# Patient Record
Sex: Female | Born: 1985 | Race: Black or African American | Hispanic: No | Marital: Single | Smoking: Current every day smoker
Health system: Southern US, Community
[De-identification: ages and names within clinical notes are randomized; demographics above are authoritative.]

## PROBLEM LIST (undated history)

## (undated) ENCOUNTER — Inpatient Hospital Stay (HOSPITAL_COMMUNITY): Payer: Self-pay

## (undated) DIAGNOSIS — B999 Unspecified infectious disease: Secondary | ICD-10-CM

## (undated) DIAGNOSIS — R87629 Unspecified abnormal cytological findings in specimens from vagina: Secondary | ICD-10-CM

## (undated) DIAGNOSIS — F32A Depression, unspecified: Secondary | ICD-10-CM

## (undated) DIAGNOSIS — K8020 Calculus of gallbladder without cholecystitis without obstruction: Secondary | ICD-10-CM

## (undated) DIAGNOSIS — F99 Mental disorder, not otherwise specified: Secondary | ICD-10-CM

## (undated) DIAGNOSIS — F329 Major depressive disorder, single episode, unspecified: Secondary | ICD-10-CM

## (undated) DIAGNOSIS — N200 Calculus of kidney: Secondary | ICD-10-CM

## (undated) DIAGNOSIS — F419 Anxiety disorder, unspecified: Secondary | ICD-10-CM

## (undated) HISTORY — PX: LITHOTRIPSY: SUR834

---
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
## 2010-08-22 ENCOUNTER — Emergency Department (HOSPITAL_COMMUNITY)
Admission: EM | Admit: 2010-08-22 | Discharge: 2010-08-22 | Disposition: A | Discharge status: Home / Self Care | Payer: Medicaid Other | Attending: Emergency Medicine | Admitting: Emergency Medicine

## 2010-08-22 DIAGNOSIS — IMO0002 Reserved for concepts with insufficient information to code with codable children: Secondary | ICD-10-CM | POA: Insufficient documentation

## 2010-09-03 ENCOUNTER — Encounter: Payer: Self-pay | Admitting: Advanced Practice Midwife

## 2010-10-22 ENCOUNTER — Encounter (HOSPITAL_COMMUNITY): Payer: Self-pay | Admitting: *Deleted

## 2010-10-22 ENCOUNTER — Inpatient Hospital Stay (HOSPITAL_COMMUNITY)
Admission: AD | Admit: 2010-10-22 | Discharge: 2010-10-22 | Disposition: A | Discharge status: Home / Self Care | Payer: Medicaid Other | Source: Ambulatory Visit | Attending: Obstetrics & Gynecology | Admitting: Obstetrics & Gynecology

## 2010-10-22 DIAGNOSIS — B3731 Acute candidiasis of vulva and vagina: Secondary | ICD-10-CM | POA: Insufficient documentation

## 2010-10-22 DIAGNOSIS — R109 Unspecified abdominal pain: Secondary | ICD-10-CM | POA: Insufficient documentation

## 2010-10-22 DIAGNOSIS — B373 Candidiasis of vulva and vagina: Secondary | ICD-10-CM | POA: Insufficient documentation

## 2010-10-22 DIAGNOSIS — N949 Unspecified condition associated with female genital organs and menstrual cycle: Secondary | ICD-10-CM | POA: Insufficient documentation

## 2010-10-22 HISTORY — DX: Anxiety disorder, unspecified: F41.9

## 2010-10-22 HISTORY — DX: Depression, unspecified: F32.A

## 2010-10-22 HISTORY — DX: Mental disorder, not otherwise specified: F99

## 2010-10-22 HISTORY — DX: Major depressive disorder, single episode, unspecified: F32.9

## 2010-10-22 LAB — URINALYSIS, ROUTINE W REFLEX MICROSCOPIC
Bilirubin Urine: NEGATIVE
Glucose, UA: NEGATIVE mg/dL
Hgb urine dipstick: NEGATIVE
Ketones, ur: NEGATIVE mg/dL
Nitrite: NEGATIVE
Specific Gravity, Urine: >1.03 — ABNORMAL HIGH (ref 1.005–1.030)
pH: 6 (ref 5.0–8.0)

## 2010-10-22 LAB — POCT PREGNANCY, URINE: Preg Test, Ur: NEGATIVE

## 2010-10-22 LAB — WET PREP, GENITAL
Clue Cells Wet Prep HPF POC: NONE SEEN
Trich, Wet Prep: NONE SEEN

## 2010-10-22 MED ORDER — FLUCONAZOLE 150 MG PO TABS
150.0000 mg | ORAL_TABLET | Freq: Once | ORAL | Status: AC
  Administered 2010-10-22: 150 mg via ORAL
  Filled 2010-10-22: qty 1

## 2010-10-22 NOTE — Progress Notes (Signed)
Pt c/o vaginal discharge x1 week, white thick. Pt states "I think I have yeast infection"

## 2010-10-22 NOTE — ED Provider Notes (Signed)
History     CSN: [id number] Arrival date & time: 10/22/2010 10:56 AM   None     Chief Complaint  Patient presents with  . Vaginal Discharge   HPI Sue Clayton is a 25 y.o. female who presents to [facility]U for vaginal discharge. The discharge started a week ago. It is characterized as thick white with vaginal itching. The history was provided by the patient.   Past Medical History:  Past Surgical History  Procedure Date  . Cesarean section     No family history on file.  History  Substance Use Topics  . Smoking status: Current Everyday Smoker -- 0.2 packs/day  . Smokeless tobacco: Not on file  . Alcohol Use: No    OB History    Grav Para Term Preterm Abortions TAB SAB Ect Mult Living   5 4 4  1 1    4       Review of Systems  Constitutional: Negative for fever, chills, diaphoresis and fatigue.  HENT: Negative for ear pain, congestion, sore throat, facial swelling, neck pain, neck stiffness, dental problem and sinus pressure.   Eyes: Negative for photophobia, pain and discharge.  Respiratory: Negative for cough, chest tightness and wheezing.   Gastrointestinal: Negative for nausea, vomiting, abdominal pain, diarrhea, constipation and abdominal distention.  Genitourinary: Negative for dysuria, frequency, flank pain and difficulty urinating.  Musculoskeletal: Negative for myalgias, back pain and gait problem.  Skin: Negative for color change and rash.  Neurological: Negative for dizziness, speech difficulty, weakness, light-headedness, numbness and headaches.  Psychiatric/Behavioral: Negative for confusion and agitation.    Allergies  Review of patient's allergies indicates not on file.  Home Medications  No current outpatient prescriptions on file.  LMP 09/16/2010  Physical Exam  Nursing note and vitals reviewed. Constitutional: She is oriented to person, place, and time. She appears well-developed and well-nourished. She appears distressed.  HENT:  Head:  Normocephalic and atraumatic.  Eyes: EOM are normal.  Neck: Neck supple.  Cardiovascular: Normal rate and regular rhythm.   Pulmonary/Chest: Effort normal and breath sounds normal.  Abdominal: Soft.       Tender mildly LLQ and inguinal area. No rebound or guarding.  Genitourinary:       Thick white vaginal discharge. Cervix closed, mild CMT, uterus not enlarged.  Musculoskeletal:       Left flank pain.+  Neurological: She is alert and oriented to person, place, and time. No cranial nerve deficit.  Skin: Skin is warm and dry.   Results for orders placed during the hospital encounter of 10/22/10 (from the past 24 hour(s))  URINALYSIS, ROUTINE W REFLEX MICROSCOPIC     Status: Abnormal   Collection Time   10/22/10 11:10 AM      Component Value Range   Color, Urine YELLOW  YELLOW    Appearance CLEAR  CLEAR    Specific Gravity, Urine >1.030 (*) 1.005 - 1.030    pH 6.0  5.0 - 8.0    Glucose, UA NEGATIVE  NEGATIVE (mg/dL)   Hgb urine dipstick NEGATIVE  NEGATIVE    Bilirubin Urine NEGATIVE  NEGATIVE    Ketones, ur NEGATIVE  NEGATIVE (mg/dL)   Protein, ur NEGATIVE  NEGATIVE (mg/dL)   Urobilinogen, UA 0.2  0.0 - 1.0 (mg/dL)   Nitrite NEGATIVE  NEGATIVE    Leukocytes, UA NEGATIVE  NEGATIVE   PREGNANCY, URINE     Status: Normal   Collection Time   10/22/10 11:10 AM      Component Value  Range   Preg Test, Ur NEGATIVE    POCT PREGNANCY, URINE     Status: Normal   Collection Time   10/22/10 11:25 AM      Component Value Range   Preg Test, Ur NEGATIVE    WET PREP, GENITAL     Status: Abnormal   Collection Time   10/22/10  1:00 PM      Component Value Range   Yeast, Wet Prep FEW (*) NONE SEEN    Trich, Wet Prep NONE SEEN  NONE SEEN    Clue Cells, Wet Prep NONE SEEN  NONE SEEN    WBC, Wet Prep HPF POC FEW (*) NONE SEEN    Assessment: Monilia vaginosis   Chronic abdominal pain  Plan:  Diflucan 150 mg. Po   Follow up with [facility] for chronic pain   Return here as needed for GYN  problems.  ED Course  Procedures MDM    [facility] [place] [facility], NP 10/22/10 1317  [place], NP 10/25/10 1411

## 2010-10-22 NOTE — ED Provider Notes (Signed)
History     CSN: [id number] Arrival date & time: 10/22/2010 10:56 AM   None     Chief Complaint  Patient presents with  . Vaginal Discharge   HPI Comments: Patient states discharge is white in color and "cottage cheese like" and accompanying itching. No history of STIs in the past since the age of 25 y/o when she had Chlamydia.  She is sexually active with single female partner over the last 4 years and was on OCPs for 3 months until last month. She is present infrequently using different methods of contraception.   Past history: patient has had similar episodes, about once every 2-3 months in the past which she attributes to douching.   Abdominal Pain: is present for more than 2 years, gets worse when urination and on doing Keigel's maneuver. She had been seen by a chriropractor who diagnosed her with Nephrolithiasis.   Patient reports h/o Pregnancy induced hypertension during two of her pregnancies  Vaginal Discharge The patient's primary symptoms include genital itching and a vaginal discharge. The patient's pertinent negatives include no genital odor. This is a recurrent problem. The current episode started in the past 7 days. The problem occurs constantly. The problem has been gradually worsening. The patient is experiencing no pain. Associated symptoms include abdominal pain, dysuria, flank pain and painful intercourse. Pertinent negatives include no back pain, chills, constipation, diarrhea, fever, frequency, headaches, nausea, rash, sore throat or vomiting. Her past medical history is significant for an abdominal surgery.  Abdominal Pain This is a chronic problem. The current episode started more than 1 year ago. The onset quality is gradual. The problem occurs constantly. The problem has been unchanged. The pain is located in the LLQ and left flank. The pain is at a severity of 4/10. The pain is moderate. The quality of the pain is aching and colicky. The abdominal pain radiates to  the left flank. Associated symptoms include dysuria. Pertinent negatives include no constipation, diarrhea, fever, frequency, headaches, myalgias, nausea or vomiting. The pain is aggravated by urination. The pain is relieved by nothing. The treatment provided no relief. Her past medical history is significant for abdominal surgery.  Patient is a 25 y.o. female presenting with vaginal discharge and abdominal pain.  Vaginal Discharge This is a recurrent problem. The current episode started in the past 7 days. The problem occurs constantly. The problem has been gradually worsening. Associated symptoms include abdominal pain. Pertinent negatives include no chest pain, chills, congestion, coughing, diaphoresis, fatigue, fever, headaches, myalgias, nausea, neck pain, numbness, rash, sore throat, vomiting or weakness.  Abdominal Pain The primary symptoms of the illness include abdominal pain, dysuria and vaginal discharge. The primary symptoms of the illness do not include fever, fatigue, shortness of breath, nausea, vomiting or diarrhea. The current episode started more than 1 year ago. The onset of the illness was gradual. The problem has been unchanged.  The abdominal pain radiates to the left flank.  The dysuria is associated with dyspareunia. The dysuria is not associated with frequency.  The vaginal discharge is associated with dysuria and dyspareunia.  Symptoms associated with the illness do not include chills, diaphoresis, constipation, frequency or back pain.   Sue Clayton is a 25 y.o. female who presents to [facility]U for vaginal discharge.  Past Medical History: Diagnosed with Anxiety disorder for which she is taking Xanax and Depression for which she is taking Effaxor  Past Surgical History  Procedure Date  . Cesarean section     No  family history on file. History of HTN on Mother's side  History  Substance Use Topics  . Smoking status: Current Everyday Smoker -- 0.2 packs/day  .  Smokeless tobacco: Not on file  . Alcohol Use: No    OB History    Grav Para Term Preterm Abortions TAB SAB Ect Mult Living   5 4 4  1 1    4       Review of Systems  Constitutional: Negative.  Negative for fever, chills, diaphoresis and fatigue.  HENT: Negative for ear pain, congestion, sore throat, facial swelling, neck pain, neck stiffness, dental problem and sinus pressure.   Eyes: Negative for photophobia, pain and discharge.  Respiratory: Negative for cough, chest tightness, shortness of breath and wheezing.   Cardiovascular: Negative for chest pain and leg swelling.  Gastrointestinal: Positive for abdominal pain. Negative for nausea, vomiting, diarrhea, constipation and abdominal distention.  Genitourinary: Positive for dysuria, flank pain, vaginal discharge and dyspareunia. Negative for frequency and difficulty urinating.  Musculoskeletal: Negative for myalgias, back pain and gait problem.  Skin: Negative for color change and rash.  Neurological: Negative for dizziness, speech difficulty, weakness, light-headedness, numbness and headaches.  Psychiatric/Behavioral: Negative for confusion and agitation. The patient is nervous/anxious.     Allergies  Review of patient's allergies indicates no known allergies.  Home Medications  No current outpatient prescriptions on file.  BP 133/84  Pulse 65  Temp(Src) 97.3 F (36.3 C) (Oral)  Resp 20  Ht 5\' 7"  (1.702 m)  Wt 87.998 kg (194 lb)  BMI 30.38 kg/m2  SpO2 99%  LMP 09/16/2010  Physical Exam  Nursing note and vitals reviewed. Constitutional: She is oriented to person, place, and time. She appears well-developed and well-nourished. She appears distressed.  HENT:  Head: Normocephalic and atraumatic.  Eyes: EOM are normal.  Neck: Neck supple.  Cardiovascular: Normal rate, regular rhythm, normal heart sounds and intact distal pulses.   No murmur heard. Pulmonary/Chest: Effort normal and breath sounds normal.  Abdominal:  Soft. Bowel sounds are normal. She exhibits no fluid wave and no ascites. There is tenderness in the left lower quadrant. There is no rigidity, no rebound, no guarding and no CVA tenderness.  Musculoskeletal: Normal range of motion.  Lymphadenopathy:    She has no cervical adenopathy.  Neurological: She is alert and oriented to person, place, and time. No cranial nerve deficit.  Skin: Skin is warm and dry.  Psychiatric: She has a normal mood and affect.    ED Course  Procedures  MDM I have examined this patient with the student and assisted with plan of care.  [address], RN, FNP, [facility]          [place], [place] 10/25/10 [phone]

## 2010-10-23 LAB — GC/CHLAMYDIA PROBE AMP, GENITAL
Chlamydia, DNA Probe: NEGATIVE
GC Probe Amp, Genital: NEGATIVE

## 2010-10-25 NOTE — ED Provider Notes (Signed)
Agree with above note.  Sue Clayton H. 10/25/2010 2:36 PM

## 2010-10-25 NOTE — ED Provider Notes (Signed)
See rest of note written by [facility]. Agree with above note.  Jalal Rauch H. 10/25/2010 2:37 PM

## 2010-11-02 ENCOUNTER — Emergency Department (HOSPITAL_COMMUNITY)
Admission: EM | Admit: 2010-11-02 | Discharge: 2010-11-02 | Disposition: A | Discharge status: Home / Self Care | Payer: Medicaid Other | Attending: Emergency Medicine | Admitting: Emergency Medicine

## 2010-11-02 ENCOUNTER — Emergency Department (HOSPITAL_COMMUNITY): Payer: Medicaid Other

## 2010-11-02 DIAGNOSIS — R197 Diarrhea, unspecified: Secondary | ICD-10-CM | POA: Insufficient documentation

## 2010-11-02 DIAGNOSIS — N201 Calculus of ureter: Secondary | ICD-10-CM | POA: Insufficient documentation

## 2010-11-02 DIAGNOSIS — N760 Acute vaginitis: Secondary | ICD-10-CM | POA: Insufficient documentation

## 2010-11-02 DIAGNOSIS — N739 Female pelvic inflammatory disease, unspecified: Secondary | ICD-10-CM | POA: Insufficient documentation

## 2010-11-02 DIAGNOSIS — B9689 Other specified bacterial agents as the cause of diseases classified elsewhere: Secondary | ICD-10-CM | POA: Insufficient documentation

## 2010-11-02 DIAGNOSIS — N1330 Unspecified hydronephrosis: Secondary | ICD-10-CM | POA: Insufficient documentation

## 2010-11-02 DIAGNOSIS — R110 Nausea: Secondary | ICD-10-CM | POA: Insufficient documentation

## 2010-11-02 DIAGNOSIS — R109 Unspecified abdominal pain: Secondary | ICD-10-CM | POA: Insufficient documentation

## 2010-11-02 DIAGNOSIS — A499 Bacterial infection, unspecified: Secondary | ICD-10-CM | POA: Insufficient documentation

## 2010-11-02 LAB — URINALYSIS, ROUTINE W REFLEX MICROSCOPIC
Bilirubin Urine: NEGATIVE
Glucose, UA: NEGATIVE mg/dL
Specific Gravity, Urine: 1.016 (ref 1.005–1.030)

## 2010-11-02 LAB — BASIC METABOLIC PANEL
BUN: 9 mg/dL (ref 6–23)
Calcium: 9.9 mg/dL (ref 8.4–10.5)
GFR calc Af Amer: >90 mL/min (ref >90)
GFR calc non Af Amer: >90 mL/min (ref >90)
Glucose, Bld: 91 mg/dL (ref 70–99)
Sodium: 140 mEq/L (ref 135–145)

## 2010-11-02 LAB — CBC
Hemoglobin: 11.4 g/dL — ABNORMAL LOW (ref 12.0–15.0)
MCH: 25.6 pg — ABNORMAL LOW (ref 26.0–34.0)
MCHC: 33.6 g/dL (ref 30.0–36.0)
RDW: 15.5 % (ref 11.5–15.5)

## 2010-11-02 LAB — DIFFERENTIAL
Basophils Relative: 1 % (ref 0–1)
Eosinophils Relative: 2 % (ref 0–5)
Monocytes Absolute: 0.4 10*3/uL (ref 0.1–1.0)
Monocytes Relative: 7 % (ref 3–12)
Neutro Abs: 2.1 10*3/uL (ref 1.7–7.7)

## 2010-11-02 LAB — URINE MICROSCOPIC-ADD ON

## 2010-11-02 LAB — WET PREP, GENITAL: Trich, Wet Prep: NONE SEEN

## 2010-11-03 LAB — URINE CULTURE

## 2010-11-03 LAB — GC/CHLAMYDIA PROBE AMP, GENITAL: GC Probe Amp, Genital: NEGATIVE

## 2010-11-09 ENCOUNTER — Other Ambulatory Visit: Payer: Self-pay | Admitting: Urology

## 2010-11-18 ENCOUNTER — Other Ambulatory Visit: Payer: Self-pay

## 2010-11-18 NOTE — Pre-Procedure Instructions (Signed)
Take laxative Sunday evening 5 pm to 6 pm and have a light dinner

## 2010-11-18 NOTE — Pre-Procedure Instructions (Signed)
No Aspirin,Advil or Toradol 72 hours prior to surgery

## 2010-11-19 ENCOUNTER — Other Ambulatory Visit (HOSPITAL_COMMUNITY): Payer: Self-pay | Admitting: *Deleted

## 2010-11-22 ENCOUNTER — Ambulatory Visit (HOSPITAL_COMMUNITY)
Admission: RE | Admit: 2010-11-22 | Discharge: 2010-11-22 | Disposition: A | Discharge status: Home / Self Care | Payer: Medicaid Other | Source: Ambulatory Visit | Attending: Urology | Admitting: Urology

## 2010-11-22 ENCOUNTER — Encounter (HOSPITAL_COMMUNITY): Payer: Self-pay | Admitting: *Deleted

## 2010-11-22 ENCOUNTER — Ambulatory Visit (HOSPITAL_COMMUNITY): Payer: Medicaid Other

## 2010-11-22 ENCOUNTER — Encounter (HOSPITAL_COMMUNITY)
Admission: RE | Disposition: A | Discharge status: Home / Self Care | Payer: Self-pay | Source: Ambulatory Visit | Attending: Urology

## 2010-11-22 DIAGNOSIS — N200 Calculus of kidney: Secondary | ICD-10-CM | POA: Insufficient documentation

## 2010-11-22 DIAGNOSIS — F17200 Nicotine dependence, unspecified, uncomplicated: Secondary | ICD-10-CM | POA: Insufficient documentation

## 2010-11-22 LAB — PREGNANCY, URINE: Preg Test, Ur: NEGATIVE

## 2010-11-22 SURGERY — LITHOTRIPSY, ESWL
Anesthesia: LOCAL | Laterality: Left

## 2010-11-22 MED ORDER — DIAZEPAM 5 MG PO TABS
10.0000 mg | ORAL_TABLET | ORAL | Status: AC
  Administered 2010-11-22: 10 mg via ORAL

## 2010-11-22 MED ORDER — HYDROCODONE-ACETAMINOPHEN 7.5-650 MG PO TABS: 1.0000 | ORAL_TABLET | Freq: Four times a day (QID) | ORAL | Status: AC | PRN

## 2010-11-22 MED ORDER — HYDROCODONE-ACETAMINOPHEN 5-325 MG PO TABS: 1.0000 | ORAL_TABLET | ORAL | Status: DC | PRN

## 2010-11-22 MED ORDER — DIPHENHYDRAMINE HCL 25 MG PO CAPS
ORAL_CAPSULE | ORAL | Status: AC
  Administered 2010-11-22: 25 mg via ORAL
  Filled 2010-11-22: qty 1

## 2010-11-22 MED ORDER — DIPHENHYDRAMINE HCL 25 MG PO CAPS
25.0000 mg | ORAL_CAPSULE | ORAL | Status: AC
  Administered 2010-11-22: 25 mg via ORAL

## 2010-11-22 MED ORDER — CIPROFLOXACIN HCL 500 MG PO TABS
500.0000 mg | ORAL_TABLET | ORAL | Status: AC
  Administered 2010-11-22: 500 mg via ORAL

## 2010-11-22 MED ORDER — DIAZEPAM 5 MG PO TABS
ORAL_TABLET | ORAL | Status: AC
  Administered 2010-11-22: 10 mg via ORAL
  Filled 2010-11-22: qty 2

## 2010-11-22 MED ORDER — CIPROFLOXACIN HCL 500 MG PO TABS
ORAL_TABLET | ORAL | Status: AC
  Administered 2010-11-22: 500 mg via ORAL
  Filled 2010-11-22: qty 1

## 2010-11-22 MED ORDER — DEXTROSE-NACL 5-0.45 % IV SOLN
INTRAVENOUS | Status: DC
  Administered 2010-11-22: 17:00:00 via INTRAVENOUS

## 2010-11-22 NOTE — H&P (Signed)
  History and Physical  Chief Complaint: abdominal pain  History of Present Illness: 25 yo female seen with  1st episode of ureterolithiasis, with L flank pain as follow-up from the ER visit. She was evaluated AT [facility] and found to have moderate left hydronephrosis caused by a proximal 10 x 7 x 12mm stone. No other renal stones. Coincidental gallstones, however.   Past Medical History  Diagnosis Date  . Anxiety   . Mental disorder   . Depression    Past Surgical History  Procedure Date  . Cesarean section     Medications: I have reviewed the patient's current medications. Allergies: No Known Allergies  History reviewed. No pertinent family history. Social History:  reports that she has been smoking.  She does not have any smokeless tobacco history on file. She reports that she does not drink alcohol or use illicit drugs.  ROS: All systems are reviewed and negative except as noted.   Physical Exam:  Vital signs in last 24 hours: Temp:  [98.5 F (36.9 C)] 98.5 F (36.9 C) (11/19 1555) Pulse Rate:  [71] 71  (11/19 1555) Resp:  [14] 14  (11/19 1555) BP: (119)/(72) 119/72 mmHg (11/19 1555) SpO2:  [100 %] 100 % (11/19 1555) Weight:  [90.719 kg (200 lb)] 200 lb (90.719 kg) (11/19 1555)  Cardiovascular: Skin warm; not flushed. Moderate obese. Respiratory: Breaths quiet; no shortness of breath Abdomen: No masses. L flank pain to deep palation Neurological: Normal sensation to touch Musculoskeletal: Normal motor function arms and legs Lymphatics: No inguinal adenopathy Skin: No rashes Genitourinary: normal BUS  Laboratory Data:  Results for orders placed during the hospital encounter of 11/22/10 (from the past 24 hour(s))  PREGNANCY, URINE     Status: Normal   Collection Time   11/22/10  3:59 PM      Component Value Range   Preg Test, Ur NEGATIVE     No results found for this or any previous visit (from the past 240 hour(s)). Creatinine: No results found for this basename:  CREATININE:7 in the last 168 hours  Xrays: See report/chart  Impression/Assessment:  10mm L upper ureteral stone for lithotripsy.  Plan:  Lithotripsy. Note that we discussed all treatment options for her in the office, including JJ stent and ureteroscopy  Sue Clayton I 11/22/2010, 5:39 PM

## 2010-11-22 NOTE — Op Note (Signed)
See [facility] op note ST

## 2011-02-04 ENCOUNTER — Encounter (INDEPENDENT_AMBULATORY_CARE_PROVIDER_SITE_OTHER): Payer: Self-pay | Admitting: Surgery

## 2011-02-07 ENCOUNTER — Ambulatory Visit (INDEPENDENT_AMBULATORY_CARE_PROVIDER_SITE_OTHER): Payer: Self-pay | Admitting: Surgery

## 2011-02-14 ENCOUNTER — Ambulatory Visit (INDEPENDENT_AMBULATORY_CARE_PROVIDER_SITE_OTHER): Payer: Self-pay | Admitting: Surgery

## 2011-03-02 ENCOUNTER — Encounter (INDEPENDENT_AMBULATORY_CARE_PROVIDER_SITE_OTHER): Payer: Self-pay | Admitting: Surgery

## 2011-03-15 ENCOUNTER — Encounter (INDEPENDENT_AMBULATORY_CARE_PROVIDER_SITE_OTHER): Payer: Self-pay

## 2012-01-21 ENCOUNTER — Emergency Department (HOSPITAL_COMMUNITY)
Admission: EM | Admit: 2012-01-21 | Discharge: 2012-01-21 | Disposition: A | Discharge status: Home / Self Care | Payer: Medicaid Other | Attending: Emergency Medicine | Admitting: Emergency Medicine

## 2012-01-21 ENCOUNTER — Emergency Department (HOSPITAL_COMMUNITY): Payer: Medicaid Other

## 2012-01-21 ENCOUNTER — Encounter (HOSPITAL_COMMUNITY): Payer: Self-pay | Admitting: Emergency Medicine

## 2012-01-21 DIAGNOSIS — Z8659 Personal history of other mental and behavioral disorders: Secondary | ICD-10-CM | POA: Insufficient documentation

## 2012-01-21 DIAGNOSIS — F17200 Nicotine dependence, unspecified, uncomplicated: Secondary | ICD-10-CM | POA: Insufficient documentation

## 2012-01-21 DIAGNOSIS — N23 Unspecified renal colic: Secondary | ICD-10-CM | POA: Insufficient documentation

## 2012-01-21 DIAGNOSIS — N200 Calculus of kidney: Secondary | ICD-10-CM | POA: Insufficient documentation

## 2012-01-21 DIAGNOSIS — R110 Nausea: Secondary | ICD-10-CM | POA: Insufficient documentation

## 2012-01-21 DIAGNOSIS — Z79899 Other long term (current) drug therapy: Secondary | ICD-10-CM | POA: Insufficient documentation

## 2012-01-21 LAB — URINALYSIS, ROUTINE W REFLEX MICROSCOPIC
Glucose, UA: NEGATIVE mg/dL
Ketones, ur: 40 mg/dL — AB
Protein, ur: 30 mg/dL — AB
pH: 6 (ref 5.0–8.0)

## 2012-01-21 LAB — CBC WITH DIFFERENTIAL/PLATELET
Basophils Absolute: 0 10*3/uL (ref 0.0–0.1)
Eosinophils Relative: 2 % (ref 0–5)
HCT: 38.7 % (ref 36.0–46.0)
Hemoglobin: 13.3 g/dL (ref 12.0–15.0)
Lymphocytes Relative: 46 % (ref 12–46)
MCHC: 34.4 g/dL (ref 30.0–36.0)
MCV: 77.1 fL — ABNORMAL LOW (ref 78.0–100.0)
Monocytes Absolute: 0.4 10*3/uL (ref 0.1–1.0)
Monocytes Relative: 6 % (ref 3–12)
RDW: 15.6 % — ABNORMAL HIGH (ref 11.5–15.5)
WBC: 6.3 10*3/uL (ref 4.0–10.5)

## 2012-01-21 LAB — BASIC METABOLIC PANEL
BUN: 10 mg/dL (ref 6–23)
CO2: 22 mEq/L (ref 19–32)
Calcium: 10 mg/dL (ref 8.4–10.5)
Creatinine, Ser: 0.72 mg/dL (ref 0.50–1.10)
Glucose, Bld: 94 mg/dL (ref 70–99)

## 2012-01-21 LAB — URINE MICROSCOPIC-ADD ON

## 2012-01-21 MED ORDER — IBUPROFEN 600 MG PO TABS: 600.0000 mg | ORAL_TABLET | Freq: Three times a day (TID) | ORAL | Status: DC | PRN

## 2012-01-21 MED ORDER — HYDROCODONE-ACETAMINOPHEN 5-325 MG PO TABS: 1.0000 | ORAL_TABLET | ORAL | Status: DC | PRN

## 2012-01-21 MED ORDER — HYDROMORPHONE HCL PF 1 MG/ML IJ SOLN
1.0000 mg | Freq: Once | INTRAMUSCULAR | Status: AC
  Administered 2012-01-21: 1 mg via INTRAVENOUS
  Filled 2012-01-21: qty 1

## 2012-01-21 MED ORDER — SODIUM CHLORIDE 0.9 % IV SOLN
1000.0000 mL | Freq: Once | INTRAVENOUS | Status: AC
  Administered 2012-01-21: 1000 mL via INTRAVENOUS

## 2012-01-21 MED ORDER — SODIUM CHLORIDE 0.9 % IV SOLN
1000.0000 mL | INTRAVENOUS | Status: DC
  Administered 2012-01-21: 1000 mL via INTRAVENOUS

## 2012-01-21 MED ORDER — KETOROLAC TROMETHAMINE 30 MG/ML IJ SOLN
30.0000 mg | Freq: Once | INTRAMUSCULAR | Status: AC
  Administered 2012-01-21: 30 mg via INTRAVENOUS
  Filled 2012-01-21: qty 1

## 2012-01-21 MED ORDER — ONDANSETRON HCL 4 MG/2ML IJ SOLN
4.0000 mg | Freq: Once | INTRAMUSCULAR | Status: AC
  Administered 2012-01-21: 4 mg via INTRAVENOUS
  Filled 2012-01-21: qty 2

## 2012-01-21 NOTE — ED Provider Notes (Signed)
History     CSN: [id number]  Arrival date & time 01/21/12  0915   First MD Initiated Contact with Patient 01/21/12 1001      Chief Complaint  Patient presents with  . Flank Pain  . Nephrolithiasis    (Consider location/radiation/quality/duration/timing/severity/associated sxs/prior treatment) The history is provided by the patient.   the patient reports worsening intermittent left flank and left lower quadrant abdominal pain which began 2-3 days ago.  Today her pain became more severe.  She was nauseated.  No vomiting.  No diarrhea.  She has a history of ureteral stones and states this feels similar.  No dysuria or urinary frequency.  No vaginal complaints.  No hematemesis.  No fevers or chills.  Her pain is moderate to severe at this time.  Nothing worsens or improves her pain.  Her pain has been colicky.  Past Medical History  Diagnosis Date  . Anxiety   . Mental disorder   . Depression     Past Surgical History  Procedure Date  . Cesarean section   . Lithotripsy     No family history on file.  History  Substance Use Topics  . Smoking status: Current Every Day Smoker -- 0.2 packs/day  . Smokeless tobacco: Not on file  . Alcohol Use: No    OB History    Grav Para Term Preterm Abortions TAB SAB Ect Mult Living   5 4 4  1 1    4       Review of Systems  Genitourinary: Positive for flank pain.  All other systems reviewed and are negative.    Allergies  Review of patient's allergies indicates no known allergies.  Home Medications   Current Outpatient Rx  Name  Route  Sig  Dispense  Refill  . PHENTERMINE HCL 37.5 MG PO CAPS   Oral   Take 37.5 mg by mouth daily.         Marland Kitchen HYDROCODONE-ACETAMINOPHEN 5-325 MG PO TABS   Oral   Take 1 tablet by mouth every 4 (four) hours as needed for pain.   12 tablet   0   . IBUPROFEN 600 MG PO TABS   Oral   Take 1 tablet (600 mg total) by mouth every 8 (eight) hours as needed for pain.   15 tablet   0     BP  123/85  Pulse 52  Temp 98 F (36.7 C) (Oral)  Resp 20  SpO2 100%  LMP 01/20/2012  Physical Exam  Nursing note and vitals reviewed. Constitutional: She is oriented to person, place, and time. She appears well-developed and well-nourished.       Uncomfortable appearing  HENT:  Head: Normocephalic and atraumatic.  Eyes: EOM are normal.  Neck: Normal range of motion.  Cardiovascular: Normal rate, regular rhythm and normal heart sounds.   Pulmonary/Chest: Effort normal and breath sounds normal.  Abdominal: Soft. She exhibits no distension. There is no tenderness.  Genitourinary:       No cva tenderness  Musculoskeletal: Normal range of motion.  Neurological: She is alert and oriented to person, place, and time.  Skin: Skin is warm and dry.  Psychiatric: She has a normal mood and affect. Judgment normal.    ED Course  Procedures (including critical care time)  Labs Reviewed  CBC WITH DIFFERENTIAL - Abnormal; Notable for the following:    MCV 77.1 (*)     RDW 15.6 (*)     All other components within normal limits  URINALYSIS, ROUTINE W REFLEX MICROSCOPIC - Abnormal; Notable for the following:    Color, Urine AMBER (*)  BIOCHEMICALS MAY BE AFFECTED BY COLOR   APPearance CLOUDY (*)     Hgb urine dipstick LARGE (*)     Bilirubin Urine MODERATE (*)     Ketones, ur 40 (*)     Protein, ur 30 (*)     Leukocytes, UA SMALL (*)     All other components within normal limits  BASIC METABOLIC PANEL  PREGNANCY, URINE  URINE MICROSCOPIC-ADD ON   Ct Abdomen Pelvis Wo Contrast  01/21/2012  *RADIOLOGY REPORT*  Clinical Data: Left-sided flank pain, history of renal stones, hematuria, prior lithotripsy  CT ABDOMEN AND PELVIS WITHOUT CONTRAST  Technique:  Multidetector CT imaging of the abdomen and pelvis was performed following the standard protocol without intravenous contrast.  Comparison: 01/17/2011; 11/02/2010  Findings:  The lack of intravenous contrast limits the ability to evaluate solid  abdominal organs.  Interval passage of previously noted residual 3 mm stone within the inferior pole of the left kidney.  Currently, there are no renal stones seen within either kidney or within the expected location of either ureter or the urinary bladder.  Incidental note is made of a phlebolith within the left hemi pelvis (image 68, series 2), similar to prior abdominal CT performed 11/02/2010.  No urinary obstruction or perinephric stranding.  Normal hepatic contour.  Layering opaque gallstones are seen with an otherwise normal-appearing gallbladder.  No ascites.  Normal noncontrast appearance of the bilateral adrenal glands, pancreas and spleen.  The bowel is normal in course and caliber without wall thickening or evidence of obstruction. No pneumoperitoneum, pneumatosis or portal venous gas.  Normal caliber abdominal aorta.  No retroperitoneal, mesenteric, pelvic or inguinal lymphadenopathy.  Normal noncontrast appearance of the pelvic organs.  No discrete adnexal lesion.  No free fluid in the pelvis.  Limited visualization of the lower thorax negative for focal airspace opacity or pleural effusion.  Normal heart size.  No pericardial effusion.  No acute or aggressive osseous abnormalities.  IMPRESSION: 1.  No explanation for patient's left flank pain.  Specifically, no evidence of nephrolithiasis urinary obstruction.  2.  Cholelithiasis.   Original Report Authenticated By: Tacey Ruiz, MD    I personally reviewed the imaging tests through PACS system I reviewed available ER/hospitalization records through the EMR   1. Ureteral colic       MDM  12:35 PM Resolution of symptoms. Suspect recently passed ureteral stone given story, blood in urine and rapid improvement in symptoms. Dc home        Lyanne Co, MD 01/21/12 1236

## 2012-01-21 NOTE — ED Notes (Signed)
Pt unable to void at this time. 

## 2012-01-21 NOTE — ED Notes (Addendum)
C/o left flank pain, had kidney stone last year- states feels the same- pain started 2 days ago- worse last night- crying. Nauseated, writhing in pain

## 2012-01-21 NOTE — ED Notes (Signed)
Dr Patria Mane advised can give 1 mg of dilaudid if pt still in pain post CT.

## 2012-01-21 NOTE — ED Notes (Signed)
Pt resting comfortably in bed; states "I feel a lot better." Pt made aware to let RN know if pain starts to increase.

## 2012-07-03 ENCOUNTER — Inpatient Hospital Stay (HOSPITAL_COMMUNITY)
Admission: AD | Admit: 2012-07-03 | Discharge: 2012-07-03 | Disposition: A | Discharge status: Home / Self Care | Payer: Medicaid Other | Source: Ambulatory Visit | Attending: Family Medicine | Admitting: Family Medicine

## 2012-07-03 ENCOUNTER — Encounter (HOSPITAL_COMMUNITY): Payer: Self-pay

## 2012-07-03 DIAGNOSIS — R1032 Left lower quadrant pain: Secondary | ICD-10-CM | POA: Insufficient documentation

## 2012-07-03 DIAGNOSIS — N730 Acute parametritis and pelvic cellulitis: Secondary | ICD-10-CM

## 2012-07-03 LAB — URINALYSIS, ROUTINE W REFLEX MICROSCOPIC
Leukocytes, UA: NEGATIVE
Nitrite: NEGATIVE
Specific Gravity, Urine: 1.025 (ref 1.005–1.030)
Urobilinogen, UA: 0.2 mg/dL (ref 0.0–1.0)
pH: 7.5 (ref 5.0–8.0)

## 2012-07-03 LAB — WET PREP, GENITAL

## 2012-07-03 LAB — POCT PREGNANCY, URINE: Preg Test, Ur: NEGATIVE

## 2012-07-03 MED ORDER — AZITHROMYCIN 250 MG PO TABS
1000.0000 mg | ORAL_TABLET | ORAL | Status: AC
  Administered 2012-07-03: 1000 mg via ORAL
  Filled 2012-07-03: qty 4

## 2012-07-03 MED ORDER — AZITHROMYCIN 500 MG PO TABS: ORAL_TABLET | ORAL | Status: DC

## 2012-07-03 MED ORDER — CEFTRIAXONE SODIUM 250 MG IJ SOLR
250.0000 mg | INTRAMUSCULAR | Status: AC
  Administered 2012-07-03: 250 mg via INTRAMUSCULAR
  Filled 2012-07-03: qty 250

## 2012-07-03 MED ORDER — DEXTROSE 5 % IV SOLN
250.0000 mg | INTRAVENOUS | Status: DC
  Filled 2012-07-03: qty 250

## 2012-07-03 NOTE — MAU Provider Note (Signed)
Chart reviewed and agree with management and plan.  

## 2012-07-03 NOTE — MAU Note (Signed)
Patient is in with c/o llq pain radiating to her lower back that started a week ago but is intense today and odorous vaginal discharge. Denies vaginal bleeding, or dysuria. lmp 6/16

## 2012-07-03 NOTE — MAU Provider Note (Signed)
History of Present Illness     27 yo [phone], hcG negative with history of kidney stones in 2013, trich and chlamydia as a teenager that presents today with one week of constant LLQ pain and odorous white vaginal discharge.  LLQ "numbing" pain radiates to back and is worse after peeing.  Denies hematuria, vaginal bleeding or itching, fevers, chills, nausea, vomiting, or changes in bowel.  Reports she took her aunt's hydrocodone 10mg  for pain.  Does vaginal douching pretty frequently with her last one couple days ago.     CSN: [id number]  Arrival date and time: 07/03/12 0910   First Provider Initiated Contact with Patient 07/03/12 [phone]      Chief Complaint  Patient presents with  . Abdominal Pain  . Vaginal Discharge   HPI    Past Medical History  Diagnosis Date  . Anxiety   . Mental disorder   . Depression     Past Surgical History  Procedure Laterality Date  . Cesarean section    . Lithotripsy      History  Substance Use Topics  . Smoking status: Current Every Day Smoker -- 0.25 packs/day  . Smokeless tobacco: Not on file  . Alcohol Use: No    Allergies: No Known Allergies  No prescriptions prior to admission    ROS as per HPI  Physical Exam   Blood pressure 137/84, pulse 72, temperature 98.4 F (36.9 C), temperature source Oral, resp. rate 18, height 5\' 7"  (1.702 m), weight 85.333 kg (188 lb 2 oz), last menstrual period 06/18/2012, SpO2 100.00%.  Physical Exam General:  Lying down, cooperative, in no acute distress Cardiovascular:  RRR Lungs:  CTAB Abdomen: mild LLQ and RLQ pain, 2x2 cm mass 2-3 inches below umbilicus  Negative CVA tenderness Pelvic exam: Cervix pink, visually closed, without lesion, moderate amount white creamy discharge, vaginal walls and external genitalia normal Bimanual exam: Cervix 0/long/high, firm, anterior, neg CMT, uterus nontender, nonenlarged, adnexa with mild tenderness, no enlargement or mass bilaterally  Results for orders  placed during the hospital encounter of 07/03/12 (from the past 24 hour(s))  URINALYSIS, ROUTINE W REFLEX MICROSCOPIC     Status: None   Collection Time    07/03/12  9:30 AM      Result Value Range   Color, Urine YELLOW  YELLOW   APPearance CLEAR  CLEAR   Specific Gravity, Urine 1.025  1.005 - 1.030   pH 7.5  5.0 - 8.0   Glucose, UA NEGATIVE  NEGATIVE mg/dL   Hgb urine dipstick NEGATIVE  NEGATIVE   Bilirubin Urine NEGATIVE  NEGATIVE   Ketones, ur NEGATIVE  NEGATIVE mg/dL   Protein, ur NEGATIVE  NEGATIVE mg/dL   Urobilinogen, UA 0.2  0.0 - 1.0 mg/dL   Nitrite NEGATIVE  NEGATIVE   Leukocytes, UA NEGATIVE  NEGATIVE  POCT PREGNANCY, URINE     Status: None   Collection Time    07/03/12  9:36 AM      Result Value Range   Preg Test, Ur NEGATIVE  NEGATIVE    MAU Course  Procedures:  None    Assessment and Plan  27 yo [id number] not currently pregnant presenting with LLQ pain and vaginal discharge.    1. PID (acute pelvic inflammatory disease)     Plan: Rocepin 250 mg IM, azithromycin 1000 mg PO x1 dose now, 1 dose in 1 week (per Up to Date) r/t high cost of doxycycline GC pending Return to MAU if symptoms worsen or persisit  I have seen this patient and agree with the above resident's note.  LEFTWICH-KIRBY, Ziaire Bieser 07/03/2012, 10:52 AM

## 2012-07-04 LAB — GC/CHLAMYDIA PROBE AMP
CT Probe RNA: NEGATIVE
GC Probe RNA: NEGATIVE

## 2013-04-16 ENCOUNTER — Emergency Department (HOSPITAL_COMMUNITY)
Admission: EM | Admit: 2013-04-16 | Discharge: 2013-04-16 | Disposition: A | Discharge status: Home / Self Care | Payer: Medicaid Other | Attending: Emergency Medicine | Admitting: Emergency Medicine

## 2013-04-16 ENCOUNTER — Encounter (HOSPITAL_COMMUNITY): Payer: Self-pay | Admitting: Emergency Medicine

## 2013-04-16 ENCOUNTER — Emergency Department (HOSPITAL_COMMUNITY): Payer: Medicaid Other

## 2013-04-16 DIAGNOSIS — Z3202 Encounter for pregnancy test, result negative: Secondary | ICD-10-CM | POA: Insufficient documentation

## 2013-04-16 DIAGNOSIS — Z87442 Personal history of urinary calculi: Secondary | ICD-10-CM | POA: Insufficient documentation

## 2013-04-16 DIAGNOSIS — Z792 Long term (current) use of antibiotics: Secondary | ICD-10-CM | POA: Insufficient documentation

## 2013-04-16 DIAGNOSIS — Z8659 Personal history of other mental and behavioral disorders: Secondary | ICD-10-CM | POA: Insufficient documentation

## 2013-04-16 DIAGNOSIS — R110 Nausea: Secondary | ICD-10-CM | POA: Insufficient documentation

## 2013-04-16 DIAGNOSIS — N390 Urinary tract infection, site not specified: Secondary | ICD-10-CM | POA: Insufficient documentation

## 2013-04-16 DIAGNOSIS — R109 Unspecified abdominal pain: Secondary | ICD-10-CM

## 2013-04-16 DIAGNOSIS — R1032 Left lower quadrant pain: Secondary | ICD-10-CM

## 2013-04-16 DIAGNOSIS — N898 Other specified noninflammatory disorders of vagina: Secondary | ICD-10-CM | POA: Insufficient documentation

## 2013-04-16 DIAGNOSIS — F17200 Nicotine dependence, unspecified, uncomplicated: Secondary | ICD-10-CM | POA: Insufficient documentation

## 2013-04-16 DIAGNOSIS — Z79899 Other long term (current) drug therapy: Secondary | ICD-10-CM | POA: Insufficient documentation

## 2013-04-16 LAB — CBC WITH DIFFERENTIAL/PLATELET
BASOS ABS: 0 10*3/uL (ref 0.0–0.1)
Basophils Relative: 0 % (ref 0–1)
EOS PCT: 2 % (ref 0–5)
Eosinophils Absolute: 0.2 10*3/uL (ref 0.0–0.7)
HCT: 34.7 % — ABNORMAL LOW (ref 36.0–46.0)
Hemoglobin: 11.7 g/dL — ABNORMAL LOW (ref 12.0–15.0)
LYMPHS PCT: 50 % — AB (ref 12–46)
Lymphs Abs: 4.5 10*3/uL — ABNORMAL HIGH (ref 0.7–4.0)
MCH: 27.2 pg (ref 26.0–34.0)
MCHC: 33.7 g/dL (ref 30.0–36.0)
MCV: 80.7 fL (ref 78.0–100.0)
Monocytes Absolute: 0.7 10*3/uL (ref 0.1–1.0)
Monocytes Relative: 7 % (ref 3–12)
Neutro Abs: 3.8 10*3/uL (ref 1.7–7.7)
Neutrophils Relative %: 41 % — ABNORMAL LOW (ref 43–77)
PLATELETS: 256 10*3/uL (ref 150–400)
RBC: 4.3 MIL/uL (ref 3.87–5.11)
RDW: 14.7 % (ref 11.5–15.5)
WBC: 9.2 10*3/uL (ref 4.0–10.5)

## 2013-04-16 LAB — COMPREHENSIVE METABOLIC PANEL
ALBUMIN: 3.5 g/dL (ref 3.5–5.2)
ALT: 17 U/L (ref 0–35)
AST: 21 U/L (ref 0–37)
Alkaline Phosphatase: 75 U/L (ref 39–117)
BUN: 12 mg/dL (ref 6–23)
CALCIUM: 9.1 mg/dL (ref 8.4–10.5)
CO2: 22 meq/L (ref 19–32)
Chloride: 103 mEq/L (ref 96–112)
Creatinine, Ser: 0.75 mg/dL (ref 0.50–1.10)
GFR calc Af Amer: >90 mL/min (ref >90)
GFR calc non Af Amer: >90 mL/min (ref >90)
Glucose, Bld: 98 mg/dL (ref 70–99)
Potassium: 3.8 mEq/L (ref 3.7–5.3)
SODIUM: 140 meq/L (ref 137–147)
TOTAL PROTEIN: 7.2 g/dL (ref 6.0–8.3)
Total Bilirubin: <0.2 mg/dL — ABNORMAL LOW (ref 0.3–1.2)

## 2013-04-16 LAB — URINALYSIS, ROUTINE W REFLEX MICROSCOPIC
Bilirubin Urine: NEGATIVE
GLUCOSE, UA: NEGATIVE mg/dL
Hgb urine dipstick: NEGATIVE
Ketones, ur: NEGATIVE mg/dL
Nitrite: NEGATIVE
PH: 6 (ref 5.0–8.0)
Protein, ur: NEGATIVE mg/dL
SPECIFIC GRAVITY, URINE: 1.023 (ref 1.005–1.030)
Urobilinogen, UA: 0.2 mg/dL (ref 0.0–1.0)

## 2013-04-16 LAB — GC/CHLAMYDIA PROBE AMP
CT Probe RNA: NEGATIVE
GC Probe RNA: NEGATIVE

## 2013-04-16 LAB — WET PREP, GENITAL
Clue Cells Wet Prep HPF POC: NONE SEEN
Trich, Wet Prep: NONE SEEN
WBC, Wet Prep HPF POC: NONE SEEN
YEAST WET PREP: NONE SEEN

## 2013-04-16 LAB — I-STAT TROPONIN, ED: Troponin i, poc: 0 ng/mL (ref 0.00–0.08)

## 2013-04-16 LAB — RPR

## 2013-04-16 LAB — URINE MICROSCOPIC-ADD ON

## 2013-04-16 LAB — POC URINE PREG, ED: Preg Test, Ur: NEGATIVE

## 2013-04-16 LAB — HIV ANTIBODY (ROUTINE TESTING W REFLEX): HIV: NONREACTIVE

## 2013-04-16 LAB — LIPASE, BLOOD: Lipase: 35 U/L (ref 11–59)

## 2013-04-16 MED ORDER — OXYCODONE-ACETAMINOPHEN 5-325 MG PO TABS
2.0000 | ORAL_TABLET | Freq: Once | ORAL | Status: AC
  Administered 2013-04-16: 2 via ORAL
  Filled 2013-04-16: qty 2

## 2013-04-16 MED ORDER — CEPHALEXIN 500 MG PO CAPS: 500.0000 mg | ORAL_CAPSULE | Freq: Four times a day (QID) | ORAL | Status: DC

## 2013-04-16 MED ORDER — HYDROCODONE-ACETAMINOPHEN 5-325 MG PO TABS: 1.0000 | ORAL_TABLET | ORAL | Status: DC | PRN

## 2013-04-16 MED ORDER — MORPHINE SULFATE 4 MG/ML IJ SOLN
4.0000 mg | Freq: Once | INTRAMUSCULAR | Status: AC
  Administered 2013-04-16: 4 mg via INTRAVENOUS
  Filled 2013-04-16: qty 1

## 2013-04-16 MED ORDER — IOHEXOL 300 MG/ML  SOLN
80.0000 mL | Freq: Once | INTRAMUSCULAR | Status: AC | PRN
  Administered 2013-04-16: 80 mL via INTRAVENOUS

## 2013-04-16 MED ORDER — IOHEXOL 300 MG/ML  SOLN
25.0000 mL | Freq: Once | INTRAMUSCULAR | Status: AC | PRN
  Administered 2013-04-16: 25 mL via ORAL

## 2013-04-16 MED ORDER — PROMETHAZINE HCL 25 MG PO TABS: 25.0000 mg | ORAL_TABLET | Freq: Four times a day (QID) | ORAL | Status: DC | PRN

## 2013-04-16 NOTE — ED Provider Notes (Signed)
CSN: [id number]     Arrival date & time 04/16/13  0553 History   First MD Initiated Contact with Patient 04/16/13 [phone]     Chief Complaint  Patient presents with  . Flank Pain     (Consider location/radiation/quality/duration/timing/severity/associated sxs/prior Treatment) The history is provided by the patient and medical records. No language interpreter was used.    Pryor MontesLoushana C Carmicheal is a 28 y.o. female  with a hx of anxiety, depression, kidney stone (last one > 1 year ago) presents to the Emergency Department complaining of intermittent progressively worsening left flank and LLQ abd pain onset 1 month ago. Pain is described as sharp and radiates into her back.  The pain became unbearable last night.  She has tolerated PO without difficulty throughout the entire time she has had pain.  Associated symptoms include increased pain with urination, but no burning.  Pt has been taking tylenol #3 and ibuprofen without relief.  Pt denies fever, chills, headache, neck pain, chest pain, SOB, vaginal pain, hematuria.  Pt reports white vaginal discharge.  Pt is sexually active with 1 partner in the last year and remote hx of STD.  LMP: March 30th.        Past Medical History  Diagnosis Date  . Anxiety   . Mental disorder   . Depression    Past Surgical History  Procedure Laterality Date  . Cesarean section    . Lithotripsy     No family history on file. History  Substance Use Topics  . Smoking status: Current Every Day Smoker -- 0.25 packs/day  . Smokeless tobacco: Not on file  . Alcohol Use: No   OB History   Grav Para Term Preterm Abortions TAB SAB Ect Mult Living   6 4 4  2 2    4      Review of Systems  Constitutional: Negative for fever, diaphoresis, appetite change, fatigue and unexpected weight change.  HENT: Negative for mouth sores and trouble swallowing.   Respiratory: Negative for cough, chest tightness, shortness of breath, wheezing and stridor.   Cardiovascular:  Negative for chest pain and palpitations.  Gastrointestinal: Positive for nausea and abdominal pain. Negative for vomiting, diarrhea, constipation, blood in stool, abdominal distention and rectal pain.  Genitourinary: Negative for dysuria, urgency, frequency, hematuria, flank pain and difficulty urinating.  Musculoskeletal: Negative for back pain, neck pain and neck stiffness.  Skin: Negative for rash.  Neurological: Negative for weakness.  Hematological: Negative for adenopathy.  Psychiatric/Behavioral: Negative for confusion.  All other systems reviewed and are negative.     Allergies  Review of patient's allergies indicates no known allergies.  Home Medications   Current Outpatient Rx  Name  Route  Sig  Dispense  Refill  . acetaminophen-codeine (TYLENOL #3) 300-30 MG per tablet   Oral   Take 1 tablet by mouth every 4 (four) hours as needed for moderate pain.         Marland Kitchen. ibuprofen (ADVIL,MOTRIN) 800 MG tablet   Oral   Take 800 mg by mouth every 8 (eight) hours as needed for mild pain.         . cephALEXin (KEFLEX) 500 MG capsule   Oral   Take 1 capsule (500 mg total) by mouth 4 (four) times daily.   40 capsule   0   . HYDROcodone-acetaminophen (NORCO/VICODIN) 5-325 MG per tablet   Oral   Take 1-2 tablets by mouth every 4 (four) hours as needed.   15 tablet   0   .  promethazine (PHENERGAN) 25 MG tablet   Oral   Take 1 tablet (25 mg total) by mouth every 6 (six) hours as needed for nausea or vomiting.   12 tablet   0    BP 99/54  Pulse 53  Temp(Src) 97.9 F (36.6 C) (Oral)  Resp 15  Ht 5\' 7"  (1.702 m)  Wt 234 lb (106.142 kg)  BMI 36.64 kg/m2  SpO2 99%  LMP 04/01/2013 Physical Exam  Nursing note and vitals reviewed. Constitutional: She is oriented to person, place, and time. She appears well-developed and well-nourished. No distress.  Awake, alert, nontoxic appearance  HENT:  Head: Normocephalic and atraumatic.  Mouth/Throat: Oropharynx is clear and  moist. No oropharyngeal exudate.  Eyes: Conjunctivae are normal. No scleral icterus.  Neck: Normal range of motion. Neck supple.  Cardiovascular: Normal rate, regular rhythm, normal heart sounds and intact distal pulses.   No murmur heard. Pulmonary/Chest: Effort normal and breath sounds normal. No respiratory distress. She has no wheezes.  Abdominal: Soft. Bowel sounds are normal. She exhibits no distension and no mass. There is no hepatosplenomegaly. There is tenderness (LLQ, no suprapubic tenderness). There is CVA tenderness (Left). There is no rebound. Hernia confirmed negative in the right inguinal area and confirmed negative in the left inguinal area.  LLQ abd pain with guarding  no rebound, no peritoneal signs   Genitourinary: Vagina normal and uterus normal. Pelvic exam was performed with patient supine. There is no rash, tenderness or lesion on the right labia. There is no rash, tenderness or lesion on the left labia. Uterus is not deviated, not enlarged, not fixed and not tender. Cervix exhibits no motion tenderness, no discharge and no friability. Right adnexum displays no mass, no tenderness and no fullness. Left adnexum displays no mass, no tenderness and no fullness. No erythema, tenderness or bleeding around the vagina. No foreign body around the vagina. No signs of injury around the vagina. No vaginal discharge found.  Musculoskeletal: Normal range of motion. She exhibits no edema.  Lymphadenopathy:    She has no cervical adenopathy.       Right: No inguinal adenopathy present.       Left: No inguinal adenopathy present.  Neurological: She is alert and oriented to person, place, and time. She exhibits normal muscle tone. Coordination normal.  Speech is clear and goal oriented Moves extremities without ataxia  Skin: Skin is warm and dry. No rash noted. She is not diaphoretic.  Psychiatric: She has a normal mood and affect.    ED Course  Procedures (including critical care  time) Labs Review Labs Reviewed  CBC WITH DIFFERENTIAL - Abnormal; Notable for the following:    Hemoglobin 11.7 (*)    HCT 34.7 (*)    Neutrophils Relative % 41 (*)    Lymphocytes Relative 50 (*)    Lymphs Abs 4.5 (*)    All other components within normal limits  COMPREHENSIVE METABOLIC PANEL - Abnormal; Notable for the following:    Total Bilirubin <0.2 (*)    All other components within normal limits  URINALYSIS, ROUTINE W REFLEX MICROSCOPIC - Abnormal; Notable for the following:    Leukocytes, UA SMALL (*)    All other components within normal limits  URINE MICROSCOPIC-ADD ON - Abnormal; Notable for the following:    Squamous Epithelial / LPF FEW (*)    Bacteria, UA FEW (*)    All other components within normal limits  WET PREP, GENITAL  GC/CHLAMYDIA PROBE AMP  URINE CULTURE  LIPASE, BLOOD  RPR  HIV ANTIBODY (ROUTINE TESTING)  POC URINE PREG, ED  I-STAT TROPOININ, ED   Imaging Review Ct Abdomen Pelvis W Contrast  04/16/2013   CLINICAL DATA:  Left-sided pain for 1 month history of kidney stones.  EXAM: CT ABDOMEN AND PELVIS WITH CONTRAST  TECHNIQUE: Multidetector CT imaging of the abdomen and pelvis was performed using the standard protocol following bolus administration of intravenous contrast.  CONTRAST:  80mL OMNIPAQUE IOHEXOL 300 MG/ML  SOLN  COMPARISON:  CT ABD/PELV WO CM dated 01/21/2012  FINDINGS: Lung bases are clear.  No pericardial fluid.  No focal hepatic lesion. The gallbladder contains several dependent gallstones. No gallbladder wall thickening or pericholecystic fluid. Pancreas, spleen, adrenal glands normal. No nephrolithiasis or ureterolithiasis. No obstructive uropathy.  The stomach, small bowel, appendix, and cecum are normal. The colon and rectosigmoid colon are normal.  Abdominal aorta is normal caliber. No retroperitoneal periportal lymphadenopathy.  No free fluid the pelvis. Uterus and ovaries are normal. Bladder is normal. No aggressive osseous lesion. Small  focus of midline subcutaneous scarring just inferior to the umbilicus is unchanged from prior.  IMPRESSION: 1. Cholelithiasis without evidence of cholecystitis. 2. No nephrolithiasis or ureterolithiasis. 3. Normal appendix   Electronically Signed   By: Genevive Bi M.D.   On: 04/16/2013 11:06     EKG Interpretation None      MDM   Final diagnoses:  Left flank pain  LLQ abdominal pain  UTI (lower urinary tract infection)   Fabio Asa C Tedder presents with LLQ abd pain and left flank pain; physical exam with mild left CVA tenderness. Pt denies vaginal or urinary symptoms.  No hgb in UA; small leuks and 7-10WBCs, but I am not sure that this is a UTI.  Will culture.  Labs reassuring, patient without leukocytosis.  Concern for possible renal calculi versus, versus diverticulitis.  PID and tubo-ovarian abscess considered though less likely. We'll obtain a pelvic exam to assess.  Patient alert, oriented, nontoxic nonseptic appearing. She is afebrile and not tachycardic.  8:40AM Exam unremarkable and without cervical motion tenderness. Wet prep without evidence of yeast or infection.  Obtain CT abdomen for further evaluation of possible kidney stones versus diverticulitis.  9:30AM Patient with pain controlled. CT scan pending.  10:32 AM CT scan still pending, patient remains comfortable. No emesis in the department.  11:30 AM CT without nephrolithiasis or other acute abnormality.  Gallstones noted, but pt has no pain in the RQU, N/V or other biliary symptoms.  Pt with painful urination.  Will treat as UTI and culture.  Patient instructed to followup with OB/GYN within 3 days for further evaluation. Also instructed to find a primary care physician for followup.  Patient given strict return precautions including intractable vomiting, fevers or worsening abdominal pain.  It has been determined that no acute conditions requiring further emergency intervention are present at this time. The  patient/guardian have been advised of the diagnosis and plan. We have discussed signs and symptoms that warrant return to the ED, such as changes or worsening in symptoms.   Vital signs are stable at discharge.   BP 99/54  Pulse 53  Temp(Src) 97.9 F (36.6 C) (Oral)  Resp 15  Ht 5\' 7"  (1.702 m)  Wt 234 lb (106.142 kg)  BMI 36.64 kg/m2  SpO2 99%  LMP 04/01/2013  Patient/guardian has voiced understanding and agreed to follow-up with the PCP or specialist.       Dierdre Forth, PA-C 04/16/13 1131

## 2013-04-16 NOTE — ED Notes (Signed)
Pt finished her oral contrast CT made aware.

## 2013-04-16 NOTE — ED Notes (Signed)
CT called reporting 15-20 mins before patient comes over. PA and patient made aware.

## 2013-04-16 NOTE — ED Notes (Signed)
Pt reports she does NOT want any pain medication for 6/10 left flank pain.

## 2013-04-16 NOTE — ED Notes (Signed)
Pelvic cart set up at bedside  

## 2013-04-16 NOTE — ED Notes (Signed)
Pt states that she has been experiencing left side pain for a month and it has gradually been getting worse. Pt states she believes it is either kidney stones or gallstones. States that she has a hx of kidney stones.

## 2013-04-16 NOTE — Discharge Instructions (Signed)
1. Medications: keflex, vicodin, phenergan, usual home medications 2. Treatment: rest, drink plenty of fluids,  3. Follow Up: Please followup with OB/GYN within 3 days for discussion of your diagnoses and further evaluation after today's visit; if you do not have a primary care doctor use the resource guide provided to find one; please return to the emergency department for worsening pain, intractable vomiting or concerning symptoms including high fever   Flank Pain Flank pain refers to pain that is located on the side of the body between the upper abdomen and the back. The pain may occur over a short period of time (acute) or may be long-term or reoccurring (chronic). It may be mild or severe. Flank pain can be caused by many things. CAUSES  Some of the more common causes of flank pain include:  Muscle strains.   Muscle spasms.   A disease of your spine (vertebral disk disease).   A lung infection (pneumonia).   Fluid around your lungs (pulmonary edema).   A kidney infection.   Kidney stones.   A very painful skin rash caused by the chickenpox virus (shingles).   Gallbladder disease.  HOME CARE INSTRUCTIONS  Home care will depend on the cause of your pain. In general,  Rest as directed by your caregiver.  Drink enough fluids to keep your urine clear or pale yellow.  Only take over-the-counter or prescription medicines as directed by your caregiver. Some medicines may help relieve the pain.  Tell your caregiver about any changes in your pain.  Follow up with your caregiver as directed. SEEK IMMEDIATE MEDICAL CARE IF:   Your pain is not controlled with medicine.   You have new or worsening symptoms.  Your pain increases.   You have abdominal pain.   You have shortness of breath.   You have persistent nausea or vomiting.   You have swelling in your abdomen.   You feel faint or pass out.   You have blood in your urine.  You have a fever or  persistent symptoms for more than 2 3 days.  You have a fever and your symptoms suddenly get worse. MAKE SURE YOU:   Understand these instructions.  Will watch your condition.  Will get help right away if you are not doing well or get worse. Document Released: 02/10/2005 Document Revised: 09/14/2011 Document Reviewed: 08/04/2011 [facility] Patient Information 2014 [place], [place].    Abdominal Pain, Women Abdominal (stomach, pelvic, or belly) pain can be caused by many things. It is important to tell your doctor:  The location of the pain.  Does it come and go or is it present all the time?  Are there things that start the pain (eating certain foods, exercise)?  Are there other symptoms associated with the pain (fever, nausea, vomiting, diarrhea)? All of this is helpful to know when trying to find the cause of the pain. CAUSES   Stomach: virus or bacteria infection, or ulcer.  Intestine: appendicitis (inflamed appendix), regional ileitis (Crohn's disease), ulcerative colitis (inflamed colon), irritable bowel syndrome, diverticulitis (inflamed diverticulum of the colon), or cancer of the stomach or intestine.  Gallbladder disease or stones in the gallbladder.  Kidney disease, kidney stones, or infection.  Pancreas infection or cancer.  Fibromyalgia (pain disorder).  Diseases of the female organs:  Uterus: fibroid (non-cancerous) tumors or infection.  Fallopian tubes: infection or tubal pregnancy.  Ovary: cysts or tumors.  Pelvic adhesions (scar tissue).  Endometriosis (uterus lining tissue growing in the pelvis and on the  pelvic organs).  Pelvic congestion syndrome (female organs filling up with blood just before the menstrual period).  Pain with the menstrual period.  Pain with ovulation (producing an egg).  Pain with an IUD (intrauterine device, birth control) in the uterus.  Cancer of the female organs.  Functional pain (pain not caused by a disease, may  improve without treatment).  Psychological pain.  Depression. DIAGNOSIS  Your doctor will decide the seriousness of your pain by doing an examination.  Blood tests.  X-rays.  Ultrasound.  CT scan (computed tomography, special type of X-ray).  MRI (magnetic resonance imaging).  Cultures, for infection.  Barium enema (dye inserted in the large intestine, to better view it with X-rays).  Colonoscopy (looking in intestine with a lighted tube).  Laparoscopy (minor surgery, looking in abdomen with a lighted tube).  Major abdominal exploratory surgery (looking in abdomen with a large incision). TREATMENT  The treatment will depend on the cause of the pain.   Many cases can be observed and treated at home.  Over-the-counter medicines recommended by your caregiver.  Prescription medicine.  Antibiotics, for infection.  Birth control pills, for painful periods or for ovulation pain.  Hormone treatment, for endometriosis.  Nerve blocking injections.  Physical therapy.  Antidepressants.  Counseling with a psychologist or psychiatrist.  Minor or major surgery. HOME CARE INSTRUCTIONS   Do not take laxatives, unless directed by your caregiver.  Take over-the-counter pain medicine only if ordered by your caregiver. Do not take aspirin because it can cause an upset stomach or bleeding.  Try a clear liquid diet (broth or water) as ordered by your caregiver. Slowly move to a bland diet, as tolerated, if the pain is related to the stomach or intestine.  Have a thermometer and take your temperature several times a day, and record it.  Bed rest and sleep, if it helps the pain.  Avoid sexual intercourse, if it causes pain.  Avoid stressful situations.  Keep your follow-up appointments and tests, as your caregiver orders.  If the pain does not go away with medicine or surgery, you may try:  Acupuncture.  Relaxation exercises (yoga, meditation).  Group  therapy.  Counseling. SEEK MEDICAL CARE IF:   You notice certain foods cause stomach pain.  Your home care treatment is not helping your pain.  You need stronger pain medicine.  You want your IUD removed.  You feel faint or lightheaded.  You develop nausea and vomiting.  You develop a rash.  You are having side effects or an allergy to your medicine. SEEK IMMEDIATE MEDICAL CARE IF:   Your pain does not go away or gets worse.  You have a fever.  Your pain is felt only in portions of the abdomen. The right side could possibly be appendicitis. The left lower portion of the abdomen could be colitis or diverticulitis.  You are passing blood in your stools (bright red or black tarry stools, with or without vomiting).  You have blood in your urine.  You develop chills, with or without a fever.  You pass out. MAKE SURE YOU:   Understand these instructions.  Will watch your condition.  Will get help right away if you are not doing well or get worse. Document Released: 10/17/2006 Document Revised: 03/14/2011 Document Reviewed: 11/06/2008 [facility] Patient Information 2014 [place], [place].

## 2013-04-17 NOTE — ED Provider Notes (Signed)
History/physical exam/procedure(s) were performed by non-physician practitioner and as supervising physician I was immediately available for consultation/collaboration. I have reviewed all notes and am in agreement with care and plan.   Hilario Quarryanielle S Violet Cart, MD 04/17/13 [phone]

## 2013-04-19 ENCOUNTER — Inpatient Hospital Stay (HOSPITAL_COMMUNITY)
Admission: AD | Admit: 2013-04-19 | Discharge: 2013-04-19 | Disposition: A | Discharge status: Home / Self Care | Payer: Medicaid Other | Source: Ambulatory Visit | Attending: Family Medicine | Admitting: Family Medicine

## 2013-04-19 ENCOUNTER — Inpatient Hospital Stay (HOSPITAL_COMMUNITY): Payer: Medicaid Other

## 2013-04-19 ENCOUNTER — Encounter (HOSPITAL_COMMUNITY): Payer: Self-pay | Admitting: *Deleted

## 2013-04-19 DIAGNOSIS — Z87442 Personal history of urinary calculi: Secondary | ICD-10-CM | POA: Insufficient documentation

## 2013-04-19 DIAGNOSIS — R1032 Left lower quadrant pain: Secondary | ICD-10-CM | POA: Insufficient documentation

## 2013-04-19 DIAGNOSIS — G8929 Other chronic pain: Secondary | ICD-10-CM

## 2013-04-19 DIAGNOSIS — B373 Candidiasis of vulva and vagina: Secondary | ICD-10-CM | POA: Insufficient documentation

## 2013-04-19 DIAGNOSIS — N390 Urinary tract infection, site not specified: Secondary | ICD-10-CM | POA: Insufficient documentation

## 2013-04-19 DIAGNOSIS — F17200 Nicotine dependence, unspecified, uncomplicated: Secondary | ICD-10-CM | POA: Insufficient documentation

## 2013-04-19 DIAGNOSIS — B3731 Acute candidiasis of vulva and vagina: Secondary | ICD-10-CM | POA: Insufficient documentation

## 2013-04-19 LAB — URINE CULTURE: Colony Count: 70000

## 2013-04-19 LAB — URINALYSIS, ROUTINE W REFLEX MICROSCOPIC
BILIRUBIN URINE: NEGATIVE
GLUCOSE, UA: NEGATIVE mg/dL
HGB URINE DIPSTICK: NEGATIVE
KETONES UR: NEGATIVE mg/dL
Leukocytes, UA: NEGATIVE
Nitrite: NEGATIVE
Protein, ur: NEGATIVE mg/dL
Specific Gravity, Urine: 1.02 (ref 1.005–1.030)
Urobilinogen, UA: 0.2 mg/dL (ref 0.0–1.0)
pH: 7 (ref 5.0–8.0)

## 2013-04-19 LAB — WET PREP, GENITAL
CLUE CELLS WET PREP: NONE SEEN
Trich, Wet Prep: NONE SEEN

## 2013-04-19 MED ORDER — KETOROLAC TROMETHAMINE 60 MG/2ML IM SOLN
60.0000 mg | Freq: Once | INTRAMUSCULAR | Status: AC
  Administered 2013-04-19: 60 mg via INTRAMUSCULAR
  Filled 2013-04-19: qty 2

## 2013-04-19 MED ORDER — FLUCONAZOLE 150 MG PO TABS: 150.0000 mg | ORAL_TABLET | Freq: Once | ORAL | Status: DC

## 2013-04-19 MED ORDER — OXYCODONE-ACETAMINOPHEN 5-325 MG PO TABS: 1.0000 | ORAL_TABLET | Freq: Four times a day (QID) | ORAL | Status: DC | PRN

## 2013-04-19 MED ORDER — FLUCONAZOLE 150 MG PO TABS
150.0000 mg | ORAL_TABLET | Freq: Once | ORAL | Status: AC
  Administered 2013-04-19: 150 mg via ORAL
  Filled 2013-04-19: qty 1

## 2013-04-19 NOTE — Discharge Instructions (Signed)
Abdominal Pain, Adult  Many things can cause belly (abdominal) pain. Most times, the belly pain is not dangerous. Many cases of belly pain can be watched and treated at home.  HOME CARE   · Do not take medicines that help you go poop (laxatives) unless told to by your doctor.  · Only take medicine as told by your doctor.  · Eat or drink as told by your doctor. Your doctor will tell you if you should be on a special diet.  GET HELP IF:  · You do not know what is causing your belly pain.  · You have belly pain while you are sick to your stomach (nauseous) or have runny poop (diarrhea).  · You have pain while you pee or poop.  · Your belly pain wakes you up at night.  · You have belly pain that gets worse or better when you eat.  · You have belly pain that gets worse when you eat fatty foods.  GET HELP RIGHT AWAY IF:   · The pain does not go away within 2 hours.  · You have a fever.  · You keep throwing up (vomiting).  · The pain changes and is only in the right or left part of the belly.  · You have bloody or tarry looking poop.  MAKE SURE YOU:   · Understand these instructions.  · Will watch your condition.  · Will get help right away if you are not doing well or get worse.  Document Released: 06/08/2007 Document Revised: 10/10/2012 Document Reviewed: 08/29/2012  Exit[employer]® Patient Information ©2014 [employer].

## 2013-04-19 NOTE — MAU Note (Signed)
Patient states she has had left lower abdominal pain for about one year. [facility] is worse. States she is also having pelvic pain. Denies bleeding, has white vaginal discharge. Denies nausea or vomiting.

## 2013-04-19 NOTE — MAU Provider Note (Signed)
History     CSN: [id number]  Arrival date and time: 04/19/13 1639   First Provider Initiated Contact with Patient 04/19/13 1831      Chief Complaint  Patient presents with  . Abdominal Pain   HPI [name] is a 28 y.o. [phone] who presents to MAU today with complaint of LLQ pain x 1 year. The patient was seen and evaluated at [facility] on 04/16/13 for the same issue. She had a negative UPT, UA, wet prep, GC/Chlamydia and CT abdomen and pelvis performed that day. The patient states that she was told that if the pain worsened she should follow-up with [facility], although the note from [facility] does not reflect that. Today the patient states continued left sided suprapubic pain that sometimes radiates around the left flank. She states a history of kidney stones ~ 1 year ago. Today she denies UTI symptoms, hematuria, N/V/D or constipation or fever. LMP was 04/01/13 and normal. She endorses regular periods with normal flow lasting ~ 7 days. She was given Vicodin for the pain which she last took ~ 4 hour prior to arrival with minimal relief.   OB History   Grav Para Term Preterm Abortions TAB SAB Ect Mult Living   6 4 4  2 2    4       Past Medical History  Diagnosis Date  . Anxiety   . Mental disorder   . Depression     Past Surgical History  Procedure Laterality Date  . Cesarean section    . Lithotripsy      History reviewed. No pertinent family history.  History  Substance Use Topics  . Smoking status: Current Every Day Smoker -- 0.25 packs/day  . Smokeless tobacco: Not on file  . Alcohol Use: No    Allergies: No Known Allergies  Prescriptions prior to admission  Medication Sig Dispense Refill  . acetaminophen-codeine (TYLENOL #3) 300-30 MG per tablet Take 1 tablet by mouth every 4 (four) hours as needed for moderate pain.      Marland Kitchen HYDROcodone-acetaminophen (NORCO/VICODIN) 5-325 MG per tablet Take 1-2 tablets by mouth every 4 (four) hours as needed.  15 tablet  0  .  ibuprofen (ADVIL,MOTRIN) 800 MG tablet Take 800 mg by mouth every 8 (eight) hours as needed for mild pain.      . cephALEXin (KEFLEX) 500 MG capsule Take 1 capsule (500 mg total) by mouth 4 (four) times daily.  40 capsule  0  . promethazine (PHENERGAN) 25 MG tablet Take 1 tablet (25 mg total) by mouth every 6 (six) hours as needed for nausea or vomiting.  12 tablet  0    Review of Systems  Constitutional: Negative for fever and malaise/fatigue.  Gastrointestinal: Positive for abdominal pain. Negative for nausea, vomiting, diarrhea and constipation.  Genitourinary: Negative for dysuria, urgency and frequency.       + vaginal discharge Neg -vaginal bleeding   Physical Exam   Blood pressure 111/79, pulse 77, temperature 98.2 F (36.8 C), temperature source Oral, resp. rate 16, height 5\' 6"  (1.676 m), weight 109.952 kg (242 lb 6.4 oz), last menstrual period 04/01/2013, SpO2 99.00%.  Physical Exam  Constitutional: She is oriented to person, place, and time. She appears well-developed and well-nourished. No distress.  HENT:  Head: Normocephalic and atraumatic.  Cardiovascular: Normal rate, regular rhythm and normal heart sounds.   Respiratory: Effort normal and breath sounds normal. No respiratory distress.  GI: Soft. She exhibits no distension and no mass. There  is tenderness (mild tenderness to palpation of the suprapubic region more prominent on the left side). There is no rebound and no guarding.  Neurological: She is alert and oriented to person, place, and time.  Skin: Skin is warm and dry. No erythema.  Psychiatric: She has a normal mood and affect.  [facility]  Results for orders placed during the hospital encounter of 04/19/13 (from the past 24 hour(s))  URINALYSIS, ROUTINE W REFLEX MICROSCOPIC     Status: None   Collection Time    04/19/13  5:35 PM      Result Value Ref Range   Color, Urine YELLOW  YELLOW   APPearance CLEAR  CLEAR    Specific Gravity, Urine 1.020  1.005 - 1.030   pH 7.0  5.0 - 8.0   Glucose, UA NEGATIVE  NEGATIVE mg/dL   Hgb urine dipstick NEGATIVE  NEGATIVE   Bilirubin Urine NEGATIVE  NEGATIVE   Ketones, ur NEGATIVE  NEGATIVE mg/dL   Protein, ur NEGATIVE  NEGATIVE mg/dL   Urobilinogen, UA 0.2  0.0 - 1.0 mg/dL   Nitrite NEGATIVE  NEGATIVE   Leukocytes, UA NEGATIVE  NEGATIVE   [place] Transvaginal Non-ob  04/19/2013   CLINICAL DATA:  Suprapubic pain.  EXAM: TRANSABDOMINAL AND TRANSVAGINAL ULTRASOUND OF PELVIS  TECHNIQUE: Both transabdominal and transvaginal ultrasound examinations of the pelvis were performed. Transabdominal technique was performed for global imaging of the pelvis including uterus, ovaries, adnexal regions, and pelvic cul-de-sac. It was necessary to proceed with endovaginal exam following the transabdominal exam to visualize the uterus, endometrium, ovaries and adnexa .  COMPARISON:  US TRANSVAGINAL NON-OB dated 04/19/2013  FINDINGS: Uterus  Measurements: 8.9 x 4.7 x 4.6 cm. No fibroids or other mass visualized.  Endometrium  Thickness: 8 mm in thickness.  No focal abnormality visualized.  Right ovary  Measurements: Not visualized.  No adnexal masses seen.  Left ovary  Measurements: 4.3 x 1.4 x 2.0 cm, seen best transabdominally. Normal appearance/no adnexal mass.  Other findings  No free fluid.  IMPRESSION: No acute findings. Unremarkable study. Nonvisualization of the right ovary.   Electronically Signed   By: Charlett NoseKevin  Dover M.D.   On: 04/19/2013 20:41   [place] Pelvis Complete  04/19/2013   CLINICAL DATA:  Suprapubic pain.  EXAM: TRANSABDOMINAL AND TRANSVAGINAL ULTRASOUND OF PELVIS  TECHNIQUE: Both transabdominal and transvaginal ultrasound examinations of the pelvis were performed. Transabdominal technique was performed for global imaging of the pelvis including uterus, ovaries, adnexal regions, and pelvic cul-de-sac. It was necessary to proceed with endovaginal exam following the transabdominal exam to  visualize the uterus, endometrium, ovaries and adnexa .  COMPARISON:  US TRANSVAGINAL NON-OB dated 04/19/2013  FINDINGS: Uterus  Measurements: 8.9 x 4.7 x 4.6 cm. No fibroids or other mass visualized.  Endometrium  Thickness: 8 mm in thickness.  No focal abnormality visualized.  Right ovary  Measurements: Not visualized.  No adnexal masses seen.  Left ovary  Measurements: 4.3 x 1.4 x 2.0 cm, seen best transabdominally. Normal appearance/no adnexal mass.  Other findings  No free fluid.  IMPRESSION: No acute findings. Unremarkable study. Nonvisualization of the right ovary.   Electronically Signed   By: Charlett NoseKevin  Dover M.D.   On: 04/19/2013 20:41     MAU Course  Procedures None  MDM Urine culture from 04/16/13 shows 70,000 colonies of E. Coli and Klebsiella Pelvic US today to rule out GYN etiology IM toradol given in MAU - patient reports no improvement  in pain, but is driving today so narcotic pain medications will not be given Discussed at length possible origins of pain with the patient. Advised PCP to manage further evaluation. Resources given.  Assessment and Plan  A: LLQ abdominal pain, chronic Yeast vulvovaginitis UTI   P: Discharge home Rx for Percocet and Diflucan given/sent to patient's pharmacy Patient advised to establish care with PCP for further work-up Patient advised of Urine culture results from 04/16/13 and advised to take previously prescribed Keflex Patient may return to MAU as needed, however discussed that it does not appear there does not appear to be a GYN origin for her pain so [facility] or [facility] may be more appropriate.   Freddi StarrJulie N Ethier, PA-C  04/19/2013, 7:39 PM

## 2013-04-20 NOTE — MAU Provider Note (Signed)
Attestation of Attending Supervision of Advanced Practitioner (PA/CNM/NP): Evaluation and management procedures were performed by the Advanced Practitioner under my supervision and collaboration.  I have reviewed the Advanced Practitioner's note and chart, and I agree with the management and plan.  Jasmia Angst S Bellamy Rubey, MD [facility] Attending 04/20/2013 7:26 AM   

## 2013-04-20 NOTE — Progress Notes (Signed)
ED Antimicrobial Stewardship Positive Culture Follow Up   Sue Clayton is an 28 y.o. female who [facility] with a chief complaint of  Chief Complaint  Patient presents with  . Flank Pain    Recent Results (from the past 720 hour(s))  URINE CULTURE     Status: None   Collection Time    04/16/13  6:25 AM      Result Value Ref Range Status   Specimen Description URINE, CLEAN CATCH   Final   Special Requests NONE   Final   Culture  Setup Time     Final   Value: 04/16/2013 12:10     Performed at [facility]   Colony Count     Final   Value: 70,000 COLONIES/ML     Performed at [facility]   Culture     Final   Value: ESCHERICHIA COLI     KLEBSIELLA PNEUMONIAE     Performed at [facility]   Report Status 04/19/2013 FINAL   Final   Organism ID, Bacteria ESCHERICHIA COLI   Final   Organism ID, Bacteria KLEBSIELLA PNEUMONIAE   Final  WET PREP, GENITAL     Status: None   Collection Time    04/16/13  8:12 AM      Result Value Ref Range Status   Yeast Wet Prep HPF POC NONE SEEN  NONE SEEN Final   Trich, Wet Prep NONE SEEN  NONE SEEN Final   Clue Cells Wet Prep HPF POC NONE SEEN  NONE SEEN Final   WBC, Wet Prep HPF POC NONE SEEN  NONE SEEN Final  GC/CHLAMYDIA PROBE AMP     Status: None   Collection Time    04/16/13  8:12 AM      Result Value Ref Range Status   CT Probe RNA NEGATIVE  NEGATIVE Final   GC Probe RNA NEGATIVE  NEGATIVE Final   Comment: (NOTE)                                                                                               **Normal Reference Range: Negative**          Assay performed using the Gen-Probe APTIMA COMBO2 (R) Assay.     Acceptable specimen types for this assay include APTIMA Swabs (Unisex,     endocervical, urethral, or vaginal), first void urine, and ThinPrep     liquid based cytology samples.     Performed at [facility]  WET PREP, GENITAL     Status: Abnormal   Collection  Time    04/19/13  9:35 PM      Result Value Ref Range Status   Yeast Wet Prep HPF POC FEW (*) NONE SEEN Final   Trich, Wet Prep NONE SEEN  NONE SEEN Final   Clue Cells Wet Prep HPF POC NONE SEEN  NONE SEEN Final   WBC, Wet Prep HPF POC FEW (*) NONE SEEN Final   Comment: MODERATE BACTERIA SEEN    [x]  Treated with Keflex, organism (Ecoli) resistant to prescribed antimicrobial []  Patient  discharged originally without antimicrobial agent and treatment is now indicated  Recommendation: Stop Keflex. Start Bactrim DS 1 tablet PO BID x 5 days  ED Provider: Emilia BeckKaitlyn Szekalski, PA-C    Dannielle KarvonenWesley Robert Saratoga HospitalDulaney 04/20/2013, 3:14 PM Infectious Diseases Pharmacist Phone# [phone]

## 2013-04-21 ENCOUNTER — Telehealth (HOSPITAL_BASED_OUTPATIENT_CLINIC_OR_DEPARTMENT_OTHER): Payer: Self-pay | Admitting: Emergency Medicine

## 2013-04-21 NOTE — Telephone Encounter (Signed)
Post ED Visit - Positive Culture Follow-up: Successful Patient Follow-Up  Culture assessed and recommendations reviewed by: [x]  Wes Dulaney, Pharm.D., BCPS []  Celedonio MiyamotoJeremy Frens, Pharm.D., BCPS []  Georgina PillionElizabeth Martin, [address]Ph[address]D., BCPS []  MytonMinh Pham, [address]Pharm[address]D., BCPS, AAHIVP []  Estella HuskMichelle Turner, Pharm.D., BCPS, AAHIVP  Positive urine culture  []  Patient discharged without antimicrobial prescription and treatment is now indicated [x]  Organism is resistant to prescribed ED discharge antimicrobial []  Patient with positive blood cultures  Changes discussed with ED provider: Emilia BeckKaitlyn Szekalski PA-C New antibiotic prescription: Bactrim DS 1 tab BID x 5 days    [facility] HollywoodKylie Jazon Jipson 04/21/2013, 10:28 AM

## 2013-08-05 ENCOUNTER — Emergency Department (HOSPITAL_COMMUNITY): Payer: No Typology Code available for payment source

## 2013-08-05 ENCOUNTER — Encounter (HOSPITAL_COMMUNITY): Payer: Self-pay | Admitting: Emergency Medicine

## 2013-08-05 ENCOUNTER — Emergency Department (HOSPITAL_COMMUNITY)
Admission: EM | Admit: 2013-08-05 | Discharge: 2013-08-05 | Disposition: A | Discharge status: Home / Self Care | Payer: No Typology Code available for payment source | Attending: Emergency Medicine | Admitting: Emergency Medicine

## 2013-08-05 DIAGNOSIS — S99929A Unspecified injury of unspecified foot, initial encounter: Principal | ICD-10-CM

## 2013-08-05 DIAGNOSIS — F411 Generalized anxiety disorder: Secondary | ICD-10-CM | POA: Insufficient documentation

## 2013-08-05 DIAGNOSIS — S99919A Unspecified injury of unspecified ankle, initial encounter: Principal | ICD-10-CM

## 2013-08-05 DIAGNOSIS — Y92410 Unspecified street and highway as the place of occurrence of the external cause: Secondary | ICD-10-CM | POA: Insufficient documentation

## 2013-08-05 DIAGNOSIS — Z792 Long term (current) use of antibiotics: Secondary | ICD-10-CM | POA: Insufficient documentation

## 2013-08-05 DIAGNOSIS — F17200 Nicotine dependence, unspecified, uncomplicated: Secondary | ICD-10-CM | POA: Insufficient documentation

## 2013-08-05 DIAGNOSIS — S8990XA Unspecified injury of unspecified lower leg, initial encounter: Secondary | ICD-10-CM | POA: Insufficient documentation

## 2013-08-05 DIAGNOSIS — M25562 Pain in left knee: Secondary | ICD-10-CM

## 2013-08-05 DIAGNOSIS — M25561 Pain in right knee: Secondary | ICD-10-CM

## 2013-08-05 DIAGNOSIS — Y9389 Activity, other specified: Secondary | ICD-10-CM | POA: Insufficient documentation

## 2013-08-05 MED ORDER — OXYCODONE-ACETAMINOPHEN 5-325 MG PO TABS
1.0000 | ORAL_TABLET | Freq: Once | ORAL | Status: AC
Start: 2013-08-05 — End: 2013-08-05
  Administered 2013-08-05: 1 via ORAL
  Filled 2013-08-05: qty 1

## 2013-08-05 MED ORDER — OXYCODONE-ACETAMINOPHEN 5-325 MG PO TABS: 1.0000 | ORAL_TABLET | Freq: Four times a day (QID) | ORAL | Status: DC | PRN
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
## 2013-08-05 NOTE — Progress Notes (Signed)
Orthopedic Tech Progress Note Patient Details:  Sue Clayton 02/03/1985 [id number]  Ortho Devices Type of Ortho Device: Crutches Ortho Device/Splint Interventions: Ordered;Adjustment   Jennye MoccasinHughes, Shanara Schnieders Craig 08/05/2013, 5:04 PM
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
## 2013-08-05 NOTE — ED Notes (Signed)
Patient transported to X-ray 

## 2013-08-14 ENCOUNTER — Emergency Department (HOSPITAL_COMMUNITY): Payer: Medicaid Other

## 2013-08-14 ENCOUNTER — Encounter (HOSPITAL_COMMUNITY): Payer: Self-pay | Admitting: Emergency Medicine

## 2013-08-14 ENCOUNTER — Emergency Department (HOSPITAL_COMMUNITY)
Admission: EM | Admit: 2013-08-14 | Discharge: 2013-08-14 | Disposition: A | Discharge status: Home / Self Care | Payer: Medicaid Other | Attending: Emergency Medicine | Admitting: Emergency Medicine

## 2013-08-14 ENCOUNTER — Emergency Department (INDEPENDENT_AMBULATORY_CARE_PROVIDER_SITE_OTHER)
Admission: EM | Admit: 2013-08-14 | Discharge: 2013-08-14 | Disposition: A | Discharge status: Nursing Home (Includes ICF) | Payer: Medicaid Other | Source: Home / Self Care | Attending: Family Medicine | Admitting: Family Medicine

## 2013-08-14 DIAGNOSIS — R112 Nausea with vomiting, unspecified: Secondary | ICD-10-CM | POA: Diagnosis not present

## 2013-08-14 DIAGNOSIS — R221 Localized swelling, mass and lump, neck: Secondary | ICD-10-CM

## 2013-08-14 DIAGNOSIS — F17200 Nicotine dependence, unspecified, uncomplicated: Secondary | ICD-10-CM | POA: Diagnosis not present

## 2013-08-14 DIAGNOSIS — I889 Nonspecific lymphadenitis, unspecified: Secondary | ICD-10-CM

## 2013-08-14 DIAGNOSIS — L0211 Cutaneous abscess of neck: Secondary | ICD-10-CM | POA: Insufficient documentation

## 2013-08-14 DIAGNOSIS — L03221 Cellulitis of neck: Secondary | ICD-10-CM

## 2013-08-14 DIAGNOSIS — R220 Localized swelling, mass and lump, head: Secondary | ICD-10-CM | POA: Diagnosis not present

## 2013-08-14 DIAGNOSIS — Z8659 Personal history of other mental and behavioral disorders: Secondary | ICD-10-CM | POA: Insufficient documentation

## 2013-08-14 LAB — BASIC METABOLIC PANEL
Anion gap: 16 — ABNORMAL HIGH (ref 5–15)
BUN: 9 mg/dL (ref 6–23)
CO2: 19 meq/L (ref 19–32)
Calcium: 8.9 mg/dL (ref 8.4–10.5)
Chloride: 102 mEq/L (ref 96–112)
Creatinine, Ser: 0.65 mg/dL (ref 0.50–1.10)
GFR calc Af Amer: >90 mL/min (ref >90)
GFR calc non Af Amer: >90 mL/min (ref >90)
Glucose, Bld: 77 mg/dL (ref 70–99)
Potassium: 3.8 mEq/L (ref 3.7–5.3)
Sodium: 137 mEq/L (ref 137–147)

## 2013-08-14 LAB — CBC WITH DIFFERENTIAL/PLATELET
BASOS ABS: 0 10*3/uL (ref 0.0–0.1)
Basophils Relative: 0 % (ref 0–1)
Eosinophils Absolute: 0 10*3/uL (ref 0.0–0.7)
Eosinophils Relative: 0 % (ref 0–5)
HEMATOCRIT: 36.4 % (ref 36.0–46.0)
Hemoglobin: 12 g/dL (ref 12.0–15.0)
LYMPHS PCT: 19 % (ref 12–46)
Lymphs Abs: 2.7 10*3/uL (ref 0.7–4.0)
MCH: 26.3 pg (ref 26.0–34.0)
MCHC: 33 g/dL (ref 30.0–36.0)
MCV: 79.8 fL (ref 78.0–100.0)
MONO ABS: 1.1 10*3/uL — AB (ref 0.1–1.0)
Monocytes Relative: 8 % (ref 3–12)
Neutro Abs: 10.3 10*3/uL — ABNORMAL HIGH (ref 1.7–7.7)
Neutrophils Relative %: 73 % (ref 43–77)
PLATELETS: 291 10*3/uL (ref 150–400)
RBC: 4.56 MIL/uL (ref 3.87–5.11)
RDW: 15.1 % (ref 11.5–15.5)
WBC: 14.2 10*3/uL — AB (ref 4.0–10.5)

## 2013-08-14 MED ORDER — HYDROCODONE-ACETAMINOPHEN 5-325 MG PO TABS: 2.0000 | ORAL_TABLET | ORAL | Status: DC | PRN

## 2013-08-14 MED ORDER — CLINDAMYCIN PHOSPHATE 600 MG/50ML IV SOLN
600.0000 mg | Freq: Once | INTRAVENOUS | Status: AC
  Administered 2013-08-14: 600 mg via INTRAVENOUS
  Filled 2013-08-14: qty 50

## 2013-08-14 MED ORDER — HYDROMORPHONE HCL PF 1 MG/ML IJ SOLN
1.0000 mg | Freq: Once | INTRAMUSCULAR | Status: AC
  Administered 2013-08-14: 1 mg via INTRAVENOUS
  Filled 2013-08-14: qty 1

## 2013-08-14 MED ORDER — HYDROMORPHONE HCL PF 1 MG/ML IJ SOLN
0.5000 mg | Freq: Once | INTRAMUSCULAR | Status: AC
  Administered 2013-08-14: 0.5 mg via INTRAVENOUS

## 2013-08-14 MED ORDER — ONDANSETRON HCL 4 MG/2ML IJ SOLN
INTRAMUSCULAR | Status: AC
  Filled 2013-08-14: qty 2

## 2013-08-14 MED ORDER — CLINDAMYCIN HCL 150 MG PO CAPS
300.0000 mg | ORAL_CAPSULE | Freq: Three times a day (TID) | ORAL | Status: DC
Start: 2013-08-14 — End: 2013-08-19

## 2013-08-14 MED ORDER — HYDROMORPHONE HCL PF 1 MG/ML IJ SOLN
INTRAMUSCULAR | Status: AC
  Filled 2013-08-14: qty 1

## 2013-08-14 MED ORDER — IOHEXOL 300 MG/ML  SOLN
100.0000 mL | Freq: Once | INTRAMUSCULAR | Status: AC | PRN
  Administered 2013-08-14: 100 mL via INTRAVENOUS

## 2013-08-14 MED ORDER — SODIUM CHLORIDE 0.9 % IV SOLN
Freq: Once | INTRAVENOUS | Status: AC
  Administered 2013-08-14: 19:00:00 via INTRAVENOUS

## 2013-08-14 MED ORDER — ONDANSETRON HCL 4 MG/2ML IJ SOLN
4.0000 mg | Freq: Once | INTRAMUSCULAR | Status: AC
  Administered 2013-08-14: 4 mg via INTRAVENOUS

## 2013-08-14 MED ORDER — HYDROCODONE-ACETAMINOPHEN 5-325 MG PO TABS
2.0000 | ORAL_TABLET | Freq: Once | ORAL | Status: AC
  Administered 2013-08-14: 2 via ORAL
  Filled 2013-08-14: qty 2

## 2013-08-14 NOTE — ED Notes (Signed)
The pt arrived by [facility]  [facility].  Nodule on the back of her neck for 2 days.  Iv placed at [facility] and dilaudid 1mg  given iv just pta.  Alert no distress

## 2013-08-14 NOTE — ED Notes (Signed)
Pt crying at present

## 2013-08-14 NOTE — ED Notes (Signed)
Iv  Ns  22  Angio  r  Arm  Wrist

## 2013-08-14 NOTE — ED Provider Notes (Signed)
CSN: [id number]     Arrival date & time 08/14/13  1955 History   First MD Initiated Contact with Patient 08/14/13 2007     Chief Complaint  Patient presents with  . Cellulitis     (Consider location/radiation/quality/duration/timing/severity/associated sxs/prior Treatment) HPI Comments: 28 year old female transferred from urgent care for worsening left posterior neck swelling and pain for the past 2-3 days. No history of similar. No fevers, chills or sick contacts. Vaccines up to date, no history of similar. Tender to palpation. No history of MRSA or abscess. No injuries. Patient held the otherwise no diabetes history  The history is provided by the patient.    Past Medical History  Diagnosis Date  . Anxiety   . Mental disorder   . Depression    Past Surgical History  Procedure Laterality Date  . Cesarean section    . Lithotripsy     No family history on file. History  Substance Use Topics  . Smoking status: Current Every Day Smoker -- 0.25 packs/day  . Smokeless tobacco: Not on file  . Alcohol Use: No   OB History   Grav Para Term Preterm Abortions TAB SAB Ect Mult Living   6 4 4  2 2    4      Review of Systems  Constitutional: Positive for appetite change. Negative for fever and chills.  HENT: Negative for congestion.   Eyes: Negative for visual disturbance.  Respiratory: Negative for shortness of breath.   Cardiovascular: Negative for chest pain.  Gastrointestinal: Positive for nausea and vomiting. Negative for abdominal pain.  Genitourinary: Negative for dysuria and flank pain.  Musculoskeletal: Positive for neck pain. Negative for back pain and neck stiffness.  Skin: Negative for rash.  Neurological: Negative for light-headedness and headaches.      Allergies  Review of patient's allergies indicates no known allergies.  Home Medications   Prior to Admission medications   Medication Sig Start Date End Date Taking? Authorizing Provider   acetaminophen-codeine (TYLENOL #3) 300-30 MG per tablet Take 1 tablet by mouth every 4 (four) hours as needed for moderate pain.   Yes Historical Provider, MD  ibuprofen (ADVIL,MOTRIN) 200 MG tablet Take 400 mg by mouth every 6 (six) hours as needed for headache or moderate pain.   Yes Historical Provider, MD   BP 136/79  Pulse 86  Resp 22  SpO2 100%  LMP 07/27/2013 Physical Exam  Nursing note and vitals reviewed. Constitutional: She is oriented to person, place, and time. She appears well-developed and well-nourished.  HENT:  Head: Normocephalic.  Significant tenderness and swelling left posterior neck inferior to the skull. A proximal diameter 3 cm. No significant warmth, mild erythema surrounding. Supple neck otherwise and tender palpation  Eyes: Conjunctivae are normal. Right eye exhibits no discharge. Left eye exhibits no discharge.  Neck: Normal range of motion. Neck supple. No tracheal deviation present.  Cardiovascular: Normal rate and regular rhythm.   Pulmonary/Chest: Effort normal and breath sounds normal.  Abdominal: Soft. She exhibits no distension. There is no tenderness. There is no guarding.  Musculoskeletal: She exhibits tenderness. She exhibits no edema.  Neurological: She is alert and oriented to person, place, and time. No cranial nerve deficit.  Skin: Skin is warm. No rash noted.  Psychiatric: She has a normal mood and affect.    ED Course  Procedures (including critical care time) Labs Review Labs Reviewed  BASIC METABOLIC PANEL - Abnormal; Notable for the following:    Anion gap 16 (*)  All other components within normal limits  CBC WITH DIFFERENTIAL - Abnormal; Notable for the following:    WBC 14.2 (*)    Neutro Abs 10.3 (*)    Monocytes Absolute 1.1 (*)    All other components within normal limits    Imaging Review Ct Soft Tissue Neck W Contrast  08/14/2013   CLINICAL DATA:  Severe pain in the posterior aspect of the neck.  EXAM: CT NECK WITH  CONTRAST  TECHNIQUE: Multidetector CT imaging of the neck was performed using the standard protocol following the bolus administration of intravenous contrast.  CONTRAST:  OMNIPAQUE IOHEXOL 300 MG/ML  SOLN  COMPARISON:  No priors.  FINDINGS: There is a radiopaque marker on this skin in the posterolateral aspect of the neck on the left side. Deep to this marker there is extensive stranding in the subcutaneous fat, presumably indicative of inflammation. No well-defined fluid collection is noted to suggest presence of an abscess at this time. There are multiple reactive size lymph nodes throughout the cervical regions bilaterally. No enhancing soft tissue mass. Osseous structures are intact. Visualized portions of the upper thorax are unremarkable. Visualized intracranial contents are also unremarkable.  IMPRESSION: 1. The appearance of the subcutaneous fat in the posterolateral aspect of the left neck suggests potential cellulitis. No underlying abscess or other acute findings.   Electronically Signed   By: Trudie Reed M.D.   On: 08/14/2013 21:38     EKG Interpretation None      MDM   Final diagnoses:  Neck swelling  Cellulitis, neck   Patient with worsening swelling posterior neck concern for abscess/cyst/severe lymphadenitis. CT neck ordered, pain meds and blood work.  Patient improved on recheck. CT results reviewed showed cellulitis no focal abscess. IV antibiotics given in ER, oral for home and close followup discussed and recheck.  Results and differential diagnosis were discussed with the patient/parent/guardian. Close follow up outpatient was discussed, comfortable with the plan.   Medications  HYDROmorphone (DILAUDID) injection 1 mg (1 mg Intravenous Given 08/14/13 2048)  iohexol (OMNIPAQUE) 300 MG/ML solution 100 mL (100 mLs Intravenous Contrast Given 08/14/13 2108)  HYDROcodone-acetaminophen (NORCO/VICODIN) 5-325 MG per tablet 2 tablet (2 tablets Oral Given 08/14/13 2238)   clindamycin (CLEOCIN) IVPB 600 mg (0 mg Intravenous Stopped 08/14/13 2329)    Filed Vitals:   08/14/13 2200 08/14/13 2230 08/14/13 2318 08/14/13 2330  BP: 131/86 127/79 123/82 107/51  Pulse: 80 89 93 88  Resp: 22 14 20 17   SpO2: 97% 100% 100% 100%       Enid Skeens, MD 08/15/13 0133
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
## 2013-08-14 NOTE — Discharge Instructions (Signed)
If you were given medicines take as directed.  If you are on coumadin or contraceptives realize their levels and effectiveness is altered by many different medicines.  If you have any reaction (rash, tongues swelling, other) to the medicines stop taking and see a physician.   Please follow up as directed and return to the ER or see a physician for new or worsening symptoms.  Thank you. Filed Vitals:   08/14/13 2045 08/14/13 2130 08/14/13 2145 08/14/13 2200  BP: 127/81 114/76 124/80 131/86  Pulse: 72 81 90 80  Resp: 18 14 21 22   SpO2: 99% 98% 98% 97%    Cellulitis Cellulitis is an infection of the skin and the tissue under the skin. The infected area is usually red and tender. This happens most often in the arms and lower legs. HOME CARE   Take your antibiotic medicine as told. Finish the medicine even if you start to feel better.  Keep the infected arm or leg raised (elevated).  Put a warm cloth on the area up to 4 times per day.  Only take medicines as told by your doctor.  Keep all doctor visits as told. GET HELP IF:  You see red streaks on the skin coming from the infected area.  Your red area gets bigger or turns a dark color.  Your bone or joint under the infected area is painful after the skin heals.  Your infection comes back in the same area or different area.  You have a puffy (swollen) bump in the infected area.  You have new symptoms.  You have a fever. GET HELP RIGHT AWAY IF:   You feel very sleepy.  You throw up (vomit) or have watery poop (diarrhea).  You feel sick and have muscle aches and pains. MAKE SURE YOU:   Understand these instructions.  Will watch your condition.  Will get help right away if you are not doing well or get worse. Document Released: 06/08/2007 Document Revised: 05/06/2013 Document Reviewed: 03/07/2011 [facility] Patient Information 2015 [place], [place]LL[place]. This information is not intended to replace advice given to you by your  health care provider. Make sure you discuss any questions you have with your health care provider.

## 2013-08-17 ENCOUNTER — Encounter (HOSPITAL_COMMUNITY): Payer: Self-pay | Admitting: Emergency Medicine

## 2013-08-17 ENCOUNTER — Inpatient Hospital Stay (HOSPITAL_COMMUNITY)
Admission: EM | Admit: 2013-08-17 | Discharge: 2013-08-19 | DRG: 603 | Disposition: A | Discharge status: Home / Self Care | Payer: Medicaid Other | Attending: Internal Medicine | Admitting: Internal Medicine

## 2013-08-17 ENCOUNTER — Emergency Department (HOSPITAL_COMMUNITY): Payer: Medicaid Other

## 2013-08-17 DIAGNOSIS — B3731 Acute candidiasis of vulva and vagina: Secondary | ICD-10-CM | POA: Diagnosis present

## 2013-08-17 DIAGNOSIS — R300 Dysuria: Secondary | ICD-10-CM

## 2013-08-17 DIAGNOSIS — Z87442 Personal history of urinary calculi: Secondary | ICD-10-CM | POA: Diagnosis not present

## 2013-08-17 DIAGNOSIS — Z791 Long term (current) use of non-steroidal anti-inflammatories (NSAID): Secondary | ICD-10-CM

## 2013-08-17 DIAGNOSIS — L0211 Cutaneous abscess of neck: Principal | ICD-10-CM

## 2013-08-17 DIAGNOSIS — F17200 Nicotine dependence, unspecified, uncomplicated: Secondary | ICD-10-CM | POA: Diagnosis present

## 2013-08-17 DIAGNOSIS — Z79899 Other long term (current) drug therapy: Secondary | ICD-10-CM | POA: Diagnosis not present

## 2013-08-17 DIAGNOSIS — F411 Generalized anxiety disorder: Secondary | ICD-10-CM | POA: Diagnosis present

## 2013-08-17 DIAGNOSIS — B373 Candidiasis of vulva and vagina: Secondary | ICD-10-CM | POA: Diagnosis present

## 2013-08-17 DIAGNOSIS — R112 Nausea with vomiting, unspecified: Secondary | ICD-10-CM | POA: Diagnosis present

## 2013-08-17 DIAGNOSIS — L03221 Cellulitis of neck: Secondary | ICD-10-CM | POA: Diagnosis present

## 2013-08-17 LAB — CBC WITH DIFFERENTIAL/PLATELET
Basophils Absolute: 0 10*3/uL (ref 0.0–0.1)
Basophils Relative: 0 % (ref 0–1)
Eosinophils Absolute: 0.2 10*3/uL (ref 0.0–0.7)
Eosinophils Relative: 2 % (ref 0–5)
HEMATOCRIT: 36.2 % (ref 36.0–46.0)
HEMOGLOBIN: 12.2 g/dL (ref 12.0–15.0)
LYMPHS PCT: 33 % (ref 12–46)
Lymphs Abs: 3.5 10*3/uL (ref 0.7–4.0)
MCH: 26 pg (ref 26.0–34.0)
MCHC: 33.7 g/dL (ref 30.0–36.0)
MCV: 77.2 fL — ABNORMAL LOW (ref 78.0–100.0)
MONOS PCT: 8 % (ref 3–12)
Monocytes Absolute: 0.8 10*3/uL (ref 0.1–1.0)
NEUTROS ABS: 6.2 10*3/uL (ref 1.7–7.7)
NEUTROS PCT: 57 % (ref 43–77)
Platelets: 309 10*3/uL (ref 150–400)
RBC: 4.69 MIL/uL (ref 3.87–5.11)
RDW: 14.7 % (ref 11.5–15.5)
WBC: 10.7 10*3/uL — AB (ref 4.0–10.5)

## 2013-08-17 LAB — URINALYSIS, ROUTINE W REFLEX MICROSCOPIC
Bilirubin Urine: NEGATIVE
GLUCOSE, UA: NEGATIVE mg/dL
Hgb urine dipstick: NEGATIVE
KETONES UR: 15 mg/dL — AB
Leukocytes, UA: NEGATIVE
Nitrite: NEGATIVE
PH: 6 (ref 5.0–8.0)
Protein, ur: NEGATIVE mg/dL
SPECIFIC GRAVITY, URINE: 1.025 (ref 1.005–1.030)
Urobilinogen, UA: 0.2 mg/dL (ref 0.0–1.0)

## 2013-08-17 LAB — I-STAT CHEM 8, ED
BUN: 10 mg/dL (ref 6–23)
CALCIUM ION: 1.14 mmol/L (ref 1.12–1.23)
CHLORIDE: 103 meq/L (ref 96–112)
Creatinine, Ser: 0.7 mg/dL (ref 0.50–1.10)
Glucose, Bld: 91 mg/dL (ref 70–99)
HEMATOCRIT: 41 % (ref 36.0–46.0)
Hemoglobin: 13.9 g/dL (ref 12.0–15.0)
POTASSIUM: 3.5 meq/L — AB (ref 3.7–5.3)
Sodium: 138 mEq/L (ref 137–147)
TCO2: 24 mmol/L (ref 0–100)

## 2013-08-17 LAB — PREGNANCY, URINE: Preg Test, Ur: NEGATIVE

## 2013-08-17 LAB — I-STAT CG4 LACTIC ACID, ED: Lactic Acid, Venous: 1.29 mmol/L (ref 0.5–2.2)

## 2013-08-17 MED ORDER — WHITE PETROLATUM GEL
Status: AC
  Administered 2013-08-17: 0.2
  Filled 2013-08-17: qty 5

## 2013-08-17 MED ORDER — ONDANSETRON HCL 4 MG/2ML IJ SOLN: 4.0000 mg | Freq: Three times a day (TID) | INTRAMUSCULAR | Status: AC | PRN

## 2013-08-17 MED ORDER — FLUCONAZOLE 150 MG PO TABS
150.0000 mg | ORAL_TABLET | Freq: Once | ORAL | Status: AC
  Administered 2013-08-17: 150 mg via ORAL
  Filled 2013-08-17: qty 1

## 2013-08-17 MED ORDER — SENNA 8.6 MG PO TABS
1.0000 | ORAL_TABLET | Freq: Two times a day (BID) | ORAL | Status: DC | PRN
  Administered 2013-08-18: 8.6 mg via ORAL
  Filled 2013-08-17: qty 1

## 2013-08-17 MED ORDER — ONDANSETRON HCL 4 MG/2ML IJ SOLN
4.0000 mg | Freq: Once | INTRAMUSCULAR | Status: AC
  Administered 2013-08-17: 4 mg via INTRAVENOUS
  Filled 2013-08-17: qty 2

## 2013-08-17 MED ORDER — ENOXAPARIN SODIUM 40 MG/0.4ML ~~LOC~~ SOLN
40.0000 mg | SUBCUTANEOUS | Status: DC
  Administered 2013-08-17 – 2013-08-18 (×2): 40 mg via SUBCUTANEOUS
  Filled 2013-08-17 (×3): qty 0.4

## 2013-08-17 MED ORDER — MORPHINE SULFATE 4 MG/ML IJ SOLN
4.0000 mg | Freq: Once | INTRAMUSCULAR | Status: AC
  Administered 2013-08-17: 4 mg via INTRAVENOUS
  Filled 2013-08-17: qty 1

## 2013-08-17 MED ORDER — KETOROLAC TROMETHAMINE 15 MG/ML IJ SOLN
15.0000 mg | Freq: Four times a day (QID) | INTRAMUSCULAR | Status: DC | PRN
  Administered 2013-08-17 – 2013-08-19 (×6): 15 mg via INTRAVENOUS
  Filled 2013-08-17 (×6): qty 1

## 2013-08-17 MED ORDER — VANCOMYCIN HCL IN DEXTROSE 1-5 GM/200ML-% IV SOLN
1000.0000 mg | Freq: Once | INTRAVENOUS | Status: DC
Start: 2013-08-17 — End: 2013-08-17

## 2013-08-17 MED ORDER — VANCOMYCIN HCL IN DEXTROSE 1-5 GM/200ML-% IV SOLN: 1000.0000 mg | Freq: Once | INTRAVENOUS | Status: DC

## 2013-08-17 MED ORDER — SODIUM CHLORIDE 0.9 % IV SOLN
Freq: Once | INTRAVENOUS | Status: AC
  Administered 2013-08-17: 06:00:00 via INTRAVENOUS

## 2013-08-17 MED ORDER — VANCOMYCIN HCL 10 G IV SOLR
2000.0000 mg | Freq: Once | INTRAVENOUS | Status: AC
  Administered 2013-08-17: 2000 mg via INTRAVENOUS
  Filled 2013-08-17: qty 2000

## 2013-08-17 MED ORDER — NICOTINE 21 MG/24HR TD PT24
21.0000 mg | MEDICATED_PATCH | Freq: Every day | TRANSDERMAL | Status: DC
  Administered 2013-08-17 – 2013-08-19 (×3): 21 mg via TRANSDERMAL
  Filled 2013-08-17 (×4): qty 1

## 2013-08-17 MED ORDER — VANCOMYCIN HCL IN DEXTROSE 1-5 GM/200ML-% IV SOLN
1000.0000 mg | Freq: Three times a day (TID) | INTRAVENOUS | Status: DC
  Administered 2013-08-17 – 2013-08-19 (×5): 1000 mg via INTRAVENOUS
  Filled 2013-08-17 (×7): qty 200

## 2013-08-17 NOTE — ED Notes (Signed)
The pt is c/o pain in the back of her head.  She was seen here for the same 3-4 days ago and placed on antibiotics.  Crying in triage.
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
## 2013-08-17 NOTE — ED Provider Notes (Signed)
Medical screening examination/treatment/procedure(s) were conducted as a shared visit with non-physician practitioner(s) and myself.  I personally evaluated the patient during the encounter.  Young woman with cellulitis of neck s/p clindamycin x 3 days with worsening pain. Tachycardic on arrival to the ED. In tears. CT scan a few days ago negative for abscess. We will U/S to rule out interval development of an abscess. Plan to admit.   Brandt LoosenJulie Manly, MD 08/17/13 [phone]

## 2013-08-17 NOTE — H&P (Signed)
Date: 08/17/2013               Patient Name:  Sue Clayton MRN: [id number]  DOB: 06/06/1985 Age / Sex: 28 y.o., female   PCP: No Pcp Per Patient              Medical Service: Internal Medicine Teaching Service              Attending Physician: Dr. Farley Ly, MD    First Contact: Dr. Haynes Kerns Pager: [phone]  Second Contact: Dr. Zada Girt  Pager: [phone]            After Hours (After 5p/  First Contact Pager: [phone]  weekends / holidays): Second Contact Pager: [phone]   Chief Complaint: Neck pain  History of Present Illness: Sue Clayton is a 28 y.o. female with PMH of anxiety, depression, h/o nephrolithiasis and cholelithiasis who presents with a 5-day history of skin infection at the left posterior neck. Her skin infection was diagnosed as a cellulitis at an ED visit on 08/14/2013 based on a neck CT  scan with contrast. She was treated with 3 days of clindamycin and and norco and she reports compliance with her outpatient treatment. She now reports severe pain (7/10) with radiation to the anterior aspect of the neck, and she vomited twice overnight from the pain. She has a history of "boils" that arise around the time of her period, and this current infection started as a small bump on the back of her neck not associated with her period. Denies history of MRSA infections, abscesses, or elevated blood glucose levels. Her last BM was three days ago. Endorses headaches but denies fever, chills, and nausea.   Review of Systems: Constitutional: Denies fever, chills, diaphoresis, appetite change and fatigue.  HEENT: Denies photophobia, eye pain, redness, hearing loss, ear pain, congestion, sore throat, rhinorrhea, sneezing, mouth sores, trouble swallowing, neck pain, neck stiffness and tinnitus.  Respiratory: Denies SOB, DOE, cough, chest tightness, and wheezing.  Cardiovascular: Denies chest pain, palpitations and leg swelling.  Gastrointestinal: Denies nausea,  vomiting, abdominal pain or diarrhea. However, she reports, that she has not had a bowel movement over the past 3 days. Genitourinary: Reports dysuria at the end of her urinary stream. No urgency, frequency, hematuria, flank pain and difficulty urinating.  Musculoskeletal: Denies myalgias, back pain, joint swelling, arthralgias and gait problem.  Skin: Denies pallor, rash and wound.  Neurological: Denies dizziness, seizures, syncope, weakness, lightheadedness, numbness and headaches.  Hematological: Denies adenopathy. Easy bruising, personal or family bleeding history  Psychiatric/Behavioral: Denies suicidal ideation, mood changes, confusion, nervousness, sleep disturbance and agitation  Medications Prior to Admission  Medication Sig Dispense Refill  . acetaminophen-codeine (TYLENOL #3) 300-30 MG per tablet Take 1 tablet by mouth every 4 (four) hours as needed for moderate pain.      . clindamycin (CLEOCIN) 150 MG capsule Take 2 capsules (300 mg total) by mouth 3 (three) times daily.  42 capsule  0  . HYDROcodone-acetaminophen (NORCO) 5-325 MG per tablet Take 2 tablets by mouth every 4 (four) hours as needed.  10 tablet  0  . ibuprofen (ADVIL,MOTRIN) 200 MG tablet Take 400 mg by mouth every 6 (six) hours as needed for headache or moderate pain.       Allergies: Allergies as of 08/17/2013  . (No Known Allergies)   Past Medical History  Diagnosis Date  . Anxiety   . Mental disorder   . Depression    Past Surgical  History  Procedure Laterality Date  . Cesarean section    . Lithotripsy     No family history on file. History   Social History  . Marital Status: Single    Spouse Name: N/A    Number of Children: N/A  . Years of Education: N/A   Occupational History  . Not on file.   Social History Main Topics  . Smoking status: Current Every Day Smoker -- 0.25 packs/day  . Smokeless tobacco: Not on file  . Alcohol Use: No  . Drug Use: No  . Sexual Activity: Yes    Birth  Control/ Protection: None   Other Topics Concern  . Not on file   Social History Narrative  . No narrative on file    Physical Exam: Blood pressure 105/69, pulse 69, temperature 97.7 F (36.5 C), temperature source Oral, resp. rate 18, height 5\' 6"  (1.676 m), weight 234 lb 7 oz (106.34 kg), last menstrual period 07/27/2013, SpO2 100.00%.  General: well developed, well nourished; no acute distressed, cooperative with exam Head: atraumatic, normocephalic,  Eye: pupils equal, round and reactive; sclera anicteric; normal conjunctiva  Nose/throat: oropharynx clear, moist mucous membranes, pink gums  Neck: There is a 5 x 8 cm area of induration in the posterior lateral left side of her neck. No active drainage. There is erythema extending slightly proximal to her hairline. Exquisitely tender on palpation, but without fluctuance. There is increased warmth. There small non-opened small bumps around the hairline, and nondistended. The area has been demarcated. Otherwise, the neck is supple without palpable nodules  Lungs/Chest wall: clear to auscultation bilaterally, normal work of breathing  Heart: normal rate and regular rhythm; no murmurs Pulses: radial and dorsalis pedis pulses are 2+ and symmetric  Abdomen: Normal fullness, no rebound, guarding, or rigidity; normal bowel sounds; no masses or organomegaly  Skin: warm, dry, intact, normal turgor, no rashes  Extremities: no peripheral edema, clubbing, or cyanosis Neurologic: A&O X3, CN II - XII are grossly intact. Motor strength is 5/5 in the all 4 extremities Psych: patient is alert and oriented, mood and affect are normal and congruent, thought content is normal without delusions, thought process is linear, speech is normal and non-pressured, behavior is normal  Lab results: Basic Metabolic Panel:  Recent Labs  54/09/8106/12/15 2137 08/17/13 0557  NA 137 138  K 3.8 3.5*  CL 102 103  CO2 19  --   GLUCOSE 77 91  BUN 9 10  CREATININE 0.65  0.70  CALCIUM 8.9  --    CBC:  Recent Labs  08/14/13 2137 08/17/13 0540 08/17/13 0557  WBC 14.2* 10.7*  --   NEUTROABS 10.3* 6.2  --   HGB 12.0 12.2 13.9  HCT 36.4 36.2 41.0  MCV 79.8 77.2*  --   PLT 291 309  --    Urinalysis:  Recent Labs  08/17/13 0837  COLORURINE YELLOW  LABSPEC 1.025  PHURINE 6.0  GLUCOSEU NEGATIVE  HGBUR NEGATIVE  BILIRUBINUR NEGATIVE  KETONESUR 15*  PROTEINUR NEGATIVE  UROBILINOGEN 0.2  NITRITE NEGATIVE  LEUKOCYTESUR NEGATIVE    Imaging results:  [place] Soft Tissue Head/neck  08/17/2013   CLINICAL DATA:  Possible left neck mass  EXAM: ULTRASOUND OF HEAD/NECK SOFT TISSUES  TECHNIQUE: Ultrasound examination of the head and neck soft tissues was performed in the area of clinical concern.  COMPARISON:  CT, 08/14/2013  FINDINGS: No sonographic mass. No fluid collection. No sonographic abnormality.  IMPRESSION: Negative exam.  No evidence of an abscess  or mass.   Electronically Signed   By: Amie Portland M.D.   On: 08/17/2013 08:02    Other results: EKG: None  Assessment & Plan by Problem: Active Problems:   Cellulitis, neck   29 y.o. female with PMH of anxiety, depression, h/o nephrolithiasis who presents with a 5-day history of skin infection at the left posterior neck for 5 days.  Neck cellulitis, nonpurulent: No triggering factors. A neck CT scan performed on her initial presentation to the ED on 08/14/2013 revealed left neck potential cellulitis. She was afebrile, however, heart rate was 107 and blood pressure nadir of 99/52 mmHg but currently stable. She is found to have leukocytosis of 10.7, but improved compared to her last 2 days of 14.2. Symptoms have not improved with outpatient clindamycin. Admitted for inpatient IV antibiotics. Plan. - Admitted to a MedSurg bed - d/c po clindamycin - Continue with IV vancomycin to cover strep, and community acquired MRSA.  - Consider po amoxicillin with Bactrim after improvement in a few days with IV  vancomycin. - IV Toradol for pain as needed. May add IV morphine 4 mg  if needed for pain control - Monitor CBC - Blood cultures pending. Obtained before antibiotics - Monitor clinically for improvement.  Dysuria: Patient described pain at the end of her urine stream. But she does not endorse any other urinary symptoms. Will obtain a urinalysis   Dispo: Disposition is deferred at this time, awaiting improvement of current medical problems. Anticipated discharge in approximately 1-2 day(s).   The patient does not have a current PCP (No Pcp Per Patient), therefore will be require OPC follow-up after discharge.   The patient does not have transportation limitations that hinder transportation to clinic appointments.   Signed:  Dow Adolph, MD PGY-3 Internal Medicine Teaching Service Pager: [phone] (7pm-7am) 08/17/2013, 12:09 PM

## 2013-08-17 NOTE — H&P (Signed)
I have seen the patient and reviewed the daily progress note by James Byrne MS IV and discussed the care of the patient with them.  Please see note for my findings, assessment, and plans/additions.   Signed:  Gabe Glace, MD PGY-3 Internal Medicine Teaching Service Pager: [phone] 

## 2013-08-17 NOTE — ED Notes (Addendum)
Pharmacy called about adjusted dose; sendind 2 g bag.

## 2013-08-17 NOTE — Progress Notes (Addendum)
ANTIBIOTIC CONSULT NOTE - INITIAL  Pharmacy Consult for vancomycin Indication: cellulitis  No Known Allergies  Patient Measurements: Height: 5\' 6"  (167.6 cm) Weight: 234 lb 7 oz (106.34 kg) IBW/kg (Calculated) : 59.3 Adjusted Body Weight: 78kg  Vital Signs: Temp: 98.3 F (36.8 C) (08/15 0242) BP: 107/75 mmHg (08/15 0655) Pulse Rate: 57 (08/15 0656) Intake/Output from previous day:   Intake/Output from this shift:    Labs:  Recent Labs  08/14/13 2137 08/17/13 0540 08/17/13 0557  WBC 14.2* 10.7*  --   HGB 12.0 12.2 13.9  PLT 291 309  --   CREATININE 0.65  --  0.70   Estimated Creatinine Clearance: 129.1 ml/min (by C-G formula based on Cr of 0.7). No results found for this basename: VANCOTROUGH, VANCOPEAK, VANCORANDOM, GENTTROUGH, GENTPEAK, GENTRANDOM, TOBRATROUGH, TOBRAPEAK, TOBRARND, AMIKACINPEAK, AMIKACINTROU, AMIKACIN,  in the last 72 hours   Microbiology: No results found for this or any previous visit (from the past 720 hour(s)).  Medical History: Past Medical History  Diagnosis Date  . Anxiety   . Mental disorder   . Depression     Assessment: [age] YOF seen in the ED on 8/12 with complaints of neck swelling and pain for 2-3 days prior to that visit. CT did not show an abscess. She was given a dose of IV clindamycin in the ED and sent home on PO clindamycin. She returns today with worsening pain. To get ultrasound in order to rule out development of an abscess.  SCr 0.7 with est CrCl >1300mL/min.  Goal of Therapy:  Vancomycin trough level 10-15 mcg/ml  Plan:  1. Give vancomycin 2000mg  IV x1 to complete a loading dose of ~20mg kg 2. Start vancomycin 1000mg  IV q8h 3. Follow up ultrasound results, c/s, renal function, [facility]  Amberly Livas D. Aletta Edmunds, PharmD, BCPS Clinical Pharmacist Pager: [phone] 08/17/2013 8:34 AM
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
## 2013-08-18 DIAGNOSIS — F17200 Nicotine dependence, unspecified, uncomplicated: Secondary | ICD-10-CM

## 2013-08-18 LAB — COMPREHENSIVE METABOLIC PANEL
ALBUMIN: 2.9 g/dL — AB (ref 3.5–5.2)
ALK PHOS: 61 U/L (ref 39–117)
ALT: 13 U/L (ref 0–35)
AST: 19 U/L (ref 0–37)
Anion gap: 12 (ref 5–15)
BUN: 6 mg/dL (ref 6–23)
CALCIUM: 9 mg/dL (ref 8.4–10.5)
CO2: 22 mEq/L (ref 19–32)
Chloride: 105 mEq/L (ref 96–112)
Creatinine, Ser: 0.61 mg/dL (ref 0.50–1.10)
GFR calc Af Amer: >90 mL/min (ref >90)
GFR calc non Af Amer: >90 mL/min (ref >90)
Glucose, Bld: 104 mg/dL — ABNORMAL HIGH (ref 70–99)
POTASSIUM: 3.9 meq/L (ref 3.7–5.3)
Sodium: 139 mEq/L (ref 137–147)
TOTAL PROTEIN: 6.6 g/dL (ref 6.0–8.3)
Total Bilirubin: 0.4 mg/dL (ref 0.3–1.2)

## 2013-08-18 LAB — CBC
HEMATOCRIT: 31 % — AB (ref 36.0–46.0)
HEMOGLOBIN: 10.3 g/dL — AB (ref 12.0–15.0)
MCH: 26 pg (ref 26.0–34.0)
MCHC: 33.2 g/dL (ref 30.0–36.0)
MCV: 78.3 fL (ref 78.0–100.0)
Platelets: 266 10*3/uL (ref 150–400)
RBC: 3.96 MIL/uL (ref 3.87–5.11)
RDW: 14.8 % (ref 11.5–15.5)
WBC: 6.7 10*3/uL (ref 4.0–10.5)

## 2013-08-18 MED ORDER — ALPRAZOLAM 0.25 MG PO TABS
0.2500 mg | ORAL_TABLET | Freq: Once | ORAL | Status: AC
  Administered 2013-08-18: 0.25 mg via ORAL
  Filled 2013-08-18: qty 1

## 2013-08-18 MED ORDER — CYCLOBENZAPRINE HCL 5 MG PO TABS
5.0000 mg | ORAL_TABLET | Freq: Once | ORAL | Status: AC
  Administered 2013-08-18: 5 mg via ORAL
  Filled 2013-08-18: qty 1

## 2013-08-18 MED ORDER — SULFAMETHOXAZOLE-TMP DS 800-160 MG PO TABS: 1.0000 | ORAL_TABLET | Freq: Two times a day (BID) | ORAL | Status: DC

## 2013-08-18 MED ORDER — AMOXICILLIN 500 MG PO TABS: 500.0000 mg | ORAL_TABLET | Freq: Three times a day (TID) | ORAL | Status: DC

## 2013-08-18 NOTE — Progress Notes (Signed)
Pt states that toradol is not working today for her neck pain, Dr. Jeanene Erballed, order received for flexeril.

## 2013-08-18 NOTE — H&P (Signed)
Internal Medicine Attending Admission Note Date: 08/18/2013  Patient name: Sue Clayton Medical record number: [id number] Date of birth: [birth year] Age: 28 y.o. Gender: female  I saw and evaluated the patient. I reviewed the resident's note and I agree with the resident's findings and plan as documented in the resident's note, with the following additional comments.  Chief Complaint(s): Pain in the upper neck posteriorly  History - key components related to admission: Patient is a 28 year old woman admitted through the emergency department with pain and swelling in the upper aspect of her neck posteriorly and the lower occipital area.  She was seen in the emergency department on 08/14/2013 and treated with IV clindamycin and then sent home on oral clindamycin.  She returned to the emergency department 08/17/2013 complaining of worsening pain.     Physical Exam - key components related to admission:  Filed Vitals:   08/17/13 1405 08/17/13 1806 08/17/13 2249 08/18/13 0521  BP: 136/82 128/57 106/64 122/85  Pulse: 92 75 52 82  Temp: 97.9 F (36.6 C) 98.1 F (36.7 C) 97.7 F (36.5 C) 97.8 F (36.6 C)  TempSrc: Oral Oral Oral   Resp: 22 18 18 18   Height:      Weight:      SpO2: 100% 100% 96% 98%   General: Alert, oriented, no acute distress Neck: There is an area of warmth, induration, erythema, and tenderness of the upper neck posteriorly at the base of the scalp more to the left of midline; there is no apparent fluctuance Lungs: Clear Heart: Regular; no extra sounds or murmurs Abdomen: Bowel sounds present, soft, nontender Extremities: No edema   Lab results:   Basic Metabolic Panel:  Recent Labs  11/91/47 0557 08/18/13 0446  NA 138 139  K 3.5* 3.9  CL 103 105  CO2  --  22  GLUCOSE 91 104*  BUN 10 6  CREATININE 0.70 0.61  CALCIUM  --  9.0    Liver Function Tests:  Recent Labs  08/18/13 0446  AST 19  ALT 13  ALKPHOS 61  BILITOT 0.4  PROT 6.6  ALBUMIN  2.9*    CBC:  Recent Labs  08/17/13 0540 08/17/13 0557 08/18/13 0446  WBC 10.7*  --  6.7  HGB 12.2 13.9 10.3*  HCT 36.2 41.0 31.0*  MCV 77.2*  --  78.3  PLT 309  --  266    Recent Labs  08/17/13 0540  NEUTROABS 6.2  LYMPHSABS 3.5  MONOABS 0.8  EOSABS 0.2  BASOSABS 0.0    Urinalysis    Component Value Date/Time   COLORURINE YELLOW 08/17/2013 0837   APPEARANCEUR CLEAR 08/17/2013 0837   LABSPEC 1.025 08/17/2013 0837   PHURINE 6.0 08/17/2013 0837   GLUCOSEU NEGATIVE 08/17/2013 0837   HGBUR NEGATIVE 08/17/2013 0837   BILIRUBINUR NEGATIVE 08/17/2013 0837   KETONESUR 15* 08/17/2013 0837   PROTEINUR NEGATIVE 08/17/2013 0837   UROBILINOGEN 0.2 08/17/2013 0837   NITRITE NEGATIVE 08/17/2013 0837   LEUKOCYTESUR NEGATIVE 08/17/2013 0837    Imaging results:  US Soft Tissue Head/neck  08/17/2013   CLINICAL DATA:  Possible left neck mass  EXAM: ULTRASOUND OF HEAD/NECK SOFT TISSUES  TECHNIQUE: Ultrasound examination of the head and neck soft tissues was performed in the area of clinical concern.  COMPARISON:  CT, 08/14/2013  FINDINGS: No sonographic mass. No fluid collection. No sonographic abnormality.  IMPRESSION: Negative exam.  No evidence of an abscess or mass.   Electronically Signed   By: Renard Hamper.D.  On: 08/17/2013 08:02     Assessment & Plan by Problem:  1.  Cellulitis.  Patient reports some improvement overnight on IV vancomycin.  Plan is continue IV vancomycin; pain control; await results of blood cultures; follow clinically; transition to oral antibiotic regimen that will cover both beta-hemolytic streptococci and community-acquired MRSA  when further improved.  2.  Other problems and plans as per the resident physician's note.  3.  Dr. Dalphine HandingBhardwaj will take over as attending physician tomorrow 08/19/2013.

## 2013-08-18 NOTE — Progress Notes (Signed)
Pt called nurse to room, upset and crying.  States "I want to go home, I have been up in here 3 days without a cigarette."  Pt concerned about her youngest son who is 28 years old and misbehaves.  Called Dr and obtained order for xanax which pt states she has taken before for her nerves.

## 2013-08-18 NOTE — Progress Notes (Signed)
See attending H+P

## 2013-08-18 NOTE — Progress Notes (Addendum)
I have seen the patient and reviewed the daily progress note by Aliene AltesByrne MS IV and discussed the care of the patient with them.  Please see note for my findings, assessment, and plans/additions.   Subjective: She feels pain in better.  No fevers overnight  This is day 2 of Vanc   Objective: Vital signs in last 24 hours: Filed Vitals:   08/17/13 1405 08/17/13 1806 08/17/13 2249 08/18/13 0521  BP: 136/82 128/57 106/64 122/85  Pulse: 92 75 52 82  Temp: 97.9 F (36.6 C) 98.1 F (36.7 C) 97.7 F (36.5 C) 97.8 F (36.6 C)  TempSrc: Oral Oral Oral   Resp: 22 18 18 18   Height:      Weight:      SpO2: 100% 100% 96% 98%   Weight change:   Intake/Output Summary (Last 24 hours) at 08/18/13 1100 Last data filed at 08/18/13 0900  Gross per 24 hour  Intake    480 ml  Output      0 ml  Net    480 ml    General: Vital signs reviewed. No distress.  Lungs: Clear to auscultation bilaterally  Neck: 8x5 cm mildly erythematous, warm, hard, raised, non-draining lesion in left posterolateral neck proximal and involving the hairline; resolution of <1cm lesion on right posterolateral neck. No much changed from yesterday.  Heart: RRR; no extra sounds or murmurs  Abdomen: Bowel sounds present, soft, nontender; no hepatosplenomegaly  Extremities: No bilateral ankle edema  Neurologic: Alert and oriented x3. Moves all extremities  Lab Results: Basic Metabolic Panel:  Recent Labs Lab 08/14/13 2137 08/17/13 0557 08/18/13 0446  NA 137 138 139  K 3.8 3.5* 3.9  CL 102 103 105  CO2 19  --  22  GLUCOSE 77 91 104*  BUN 9 10 6   CREATININE 0.65 0.70 0.61  CALCIUM 8.9  --  9.0   Liver Function Tests:  Recent Labs Lab 08/18/13 0446  AST 19  ALT 13  ALKPHOS 61  BILITOT 0.4  PROT 6.6  ALBUMIN 2.9*   No results found for this basename: LIPASE, AMYLASE,  in the last 168 hours No results found for this basename: AMMONIA,  in the last 168 hours CBC:  Recent Labs Lab 08/14/13 2137  08/17/13 0540 08/17/13 0557 08/18/13 0446  WBC 14.2* 10.7*  --  6.7  NEUTROABS 10.3* 6.2  --   --   HGB 12.0 12.2 13.9 10.3*  HCT 36.4 36.2 41.0 31.0*  MCV 79.8 77.2*  --  78.3  PLT 291 309  --  266   Urinalysis:  Recent Labs Lab 08/17/13 0837  COLORURINE YELLOW  LABSPEC 1.025  PHURINE 6.0  GLUCOSEU NEGATIVE  HGBUR NEGATIVE  BILIRUBINUR NEGATIVE  KETONESUR 15*  PROTEINUR NEGATIVE  UROBILINOGEN 0.2  NITRITE NEGATIVE  LEUKOCYTESUR NEGATIVE   Micro Results: Recent Results (from the past 240 hour(s))  CULTURE, BLOOD (ROUTINE X 2)     Status: None   Collection Time    08/17/13  8:45 AM      Result Value Ref Range Status   Specimen Description BLOOD RIGHT ARM   Final   Special Requests BOTTLES DRAWN AEROBIC AND ANAEROBIC 10CC   Final   Culture  Setup Time     Final   Value: 08/17/2013 18:00     Performed at [facility]   Culture     Final   Value:        BLOOD CULTURE RECEIVED NO GROWTH TO DATE CULTURE  WILL BE HELD FOR 5 DAYS BEFORE ISSUING A FINAL NEGATIVE REPORT     Performed at [facility]   Report Status PENDING   Incomplete  CULTURE, BLOOD (ROUTINE X 2)     Status: None   Collection Time    08/17/13  8:55 AM      Result Value Ref Range Status   Specimen Description BLOOD RIGHT HAND   Final   Special Requests BOTTLES DRAWN AEROBIC ONLY 10CC   Final   Culture  Setup Time     Final   Value: 08/17/2013 18:00     Performed at [facility]   Culture     Final   Value:        BLOOD CULTURE RECEIVED NO GROWTH TO DATE CULTURE WILL BE HELD FOR 5 DAYS BEFORE ISSUING A FINAL NEGATIVE REPORT     Performed at [facility]   Report Status PENDING   Incomplete   Studies/Results: US Soft Tissue Head/neck  08/17/2013   CLINICAL DATA:  Possible left neck mass  EXAM: ULTRASOUND OF HEAD/NECK SOFT TISSUES  TECHNIQUE: Ultrasound examination of the head and neck soft tissues was performed in the area of clinical concern.  COMPARISON:  CT, 08/14/2013   FINDINGS: No sonographic mass. No fluid collection. No sonographic abnormality.  IMPRESSION: Negative exam.  No evidence of an abscess or mass.   Electronically Signed   By: Amie Portland M.D.   On: 08/17/2013 08:02   Medications: I have reviewed the patient's current medications. Scheduled Meds: . enoxaparin (LOVENOX) injection  40 mg Subcutaneous Q24H  . nicotine  21 mg Transdermal Daily  . vancomycin  1,000 mg Intravenous Q8H   Continuous Infusions:  PRN Meds:.ketorolac, senna Assessment/Plan: Active Problems:   Cellulitis, neck   Neck cellulitis, nonpurulent: Improving symptomatically but exam is unchanged. Non-purulent, mildly erythematous lesion in the left posterolateral neck that has improved subjectively. Failed outpatient antibiotics. No abscess present on U/S or prior CT. No signs or symptoms of systemic infection. Most likely Staph or Strep infection and has no major risk factor for MRSA infection.  Plan.  - Continue IV vancomycin 1 g IV q8 hours. This is day 2.  - allow one more day of IV antibiotics before switching to po abx - Consider PO Abx, such as Amoxicillin + TMP/SMX or Amoxicillin + Doxycycline, after clinical improvement  - F/u BCx  - Toradol PRN for pain  - Zofran PRN for nausea   Dysuria: Symptoms concerning for yeast infection. U/A negative for nitrites and LE.  - Diflucan 150 mg once for possible vaginal candidiasis   Tobacco use: Smokes 1 ppd at home.  - Nicotine 21 mg patch daily  Anxiety: Not medically treated at this time.  - Consider outpatient treatment   FEN: regular diet  - Replete electrolytes, as necessary  - Senna 8.6 mg PO BID PRN for mild constipation   DVT PPx:  - Lovenox subcutaneous  Disposition: Will be discharged tomorrow is she continues to improve.    LOS: 1 day   Dow Adolph PGY 3 - Internal Medicine Teaching Service Pager: [phone] 08/18/2013, 11:00 AM

## 2013-08-18 NOTE — Progress Notes (Signed)
Subjective: Overall, she's endorsing symptomatic improvement in pain and swelling around her neck. She was able to sleep on her back without discomfort. She has not had a bowel movement for 3-4 days and was endorsing some mild abdominal discomfort. Denies headaches or fevers.   Objective: Vital signs in last 24 hours: Filed Vitals:   08/17/13 1405 08/17/13 1806 08/17/13 2249 08/18/13 0521  BP: 136/82 128/57 106/64 122/85  Pulse: 92 75 52 82  Temp: 97.9 F (36.6 C) 98.1 F (36.7 C) 97.7 F (36.5 C) 97.8 F (36.6 C)  TempSrc: Oral Oral Oral   Resp: 22 18 18 18   Height:      Weight:      SpO2: 100% 100% 96% 98%   Weight change:   Intake/Output Summary (Last 24 hours) at 08/18/13 0835 Last data filed at 08/17/13 1300  Gross per 24 hour  Intake    240 ml  Output      0 ml  Net    240 ml   BP 122/85  Pulse 82  Temp(Src) 97.8 F (36.6 C) (Oral)  Resp 18  Ht 5\' 6"  (1.676 m)  Wt 106.34 kg (234 lb 7 oz)  BMI 37.86 kg/m2  SpO2 98%  LMP 07/27/2013 General appearance: alert, cooperative and no distress  Neck: 8x5 cm mildly erythematous, warm, hard, raised, non-draining lesion in left posterolateral neck proximal and involving the hairline; resolution of <1cm lesion on right posterolateral neck  Lungs: clear to auscultation bilaterally  Heart: regular rate and rhythm, S1, S2 normal, no murmur, click, rub or gallop  Abdomen: soft, bowel sounds normal, mild tenderness to palpation in upper quadrants   Lab Results: Basic Metabolic Panel:  Recent Labs Lab 08/14/13 2137 08/17/13 0557 08/18/13 0446  NA 137 138 139  K 3.8 3.5* 3.9  CL 102 103 105  CO2 19  --  22  GLUCOSE 77 91 104*  BUN 9 10 6   CREATININE 0.65 0.70 0.61  CALCIUM 8.9  --  9.0   Liver Function Tests:  Recent Labs Lab 08/18/13 0446  AST 19  ALT 13  ALKPHOS 61  BILITOT 0.4  PROT 6.6  ALBUMIN 2.9*   CBC:  Recent Labs Lab 08/14/13 2137 08/17/13 0540 08/17/13 0557 08/18/13 0446  WBC 14.2* 10.7*   --  6.7  NEUTROABS 10.3* 6.2  --   --   HGB 12.0 12.2 13.9 10.3*  HCT 36.4 36.2 41.0 31.0*  MCV 79.8 77.2*  --  78.3  PLT 291 309  --  266   Urinalysis:  Recent Labs Lab 08/17/13 0837  COLORURINE YELLOW  LABSPEC 1.025  PHURINE 6.0  GLUCOSEU NEGATIVE  HGBUR NEGATIVE  BILIRUBINUR NEGATIVE  KETONESUR 15*  PROTEINUR NEGATIVE  UROBILINOGEN 0.2  NITRITE NEGATIVE  LEUKOCYTESUR NEGATIVE   Studies/Results: US Soft Tissue Head/neck  08/17/2013   CLINICAL DATA:  Possible left neck mass  EXAM: ULTRASOUND OF HEAD/NECK SOFT TISSUES  TECHNIQUE: Ultrasound examination of the head and neck soft tissues was performed in the area of clinical concern.  COMPARISON:  CT, 08/14/2013  FINDINGS: No sonographic mass. No fluid collection. No sonographic abnormality.  IMPRESSION: Negative exam.  No evidence of an abscess or mass.   Electronically Signed   By: Amie Portland M.D.   On: 08/17/2013 08:02   Medications:  Scheduled Meds: . enoxaparin (LOVENOX) injection  40 mg Subcutaneous Q24H  . nicotine  21 mg Transdermal Daily  . vancomycin  1,000 mg Intravenous Q8H   Continuous Infusions:  PRN Meds:.ketorolac, senna  Assessment/Plan: Active Problems:   Cellulitis, neck  Sue Clayton is a 28 y.o. female with PMH of anxiety, depression, and h/o nephrolithiasis and cholelithiasis who presents with a non-purulent cellulitis in the left posterolateral neck.   Neck cellulitis, nonpurulent: Non-purulent, mildly erythematous lesion in the left posterolateral neck that has improved subjectively. Size and erythema unchanged on exam today compared to prior exam. Pt had inadequate clinical response to 3 days of clindamycin therapy before admission. No abscess present on U/S or prior CT. No signs or symptoms of systemic infection. Most likely Staph or Strep infection and has no major risk factor for MRSA infection.  Plan.  - Continue IV vancomycin 1 g IV q8 hours  - Consider PO Abx, such as Amoxicillin +  TMP/SMX or Amoxicillin + Doxycycline, after clinical improvement  - F/u BCx  - Toradol PRN for pain  - Zofran PRN for nausea   Dysuria: Symptoms concerning for yeast infection. U/A negative for nitrites and LE.  - Diflucan 150 mg PO PRN  Tobacco use: Smokes 1 ppd at home.   - Nicotine 21 mg patch daily   Anxiety: Not medically treated at this time.  - Consider outpatient treatment   FEN: Regular diet  - Replete electrolytes, as necessary  - Senna 8.6 mg PO BID PRN for mild constipation   DVT PPx:  - Lovenox subcutaneous   This is a Psychologist, occupationalMedical Student Note.  The care of the patient was discussed with Dr. Zada GirtKazibwe and the assessment and plan formulated with their assistance.  Please see their attached note for official documentation of the daily encounter.   LOS: 1 day   Foye ClockJames D Quinlee Sciarra, Med Student 08/18/2013, 8:35 AM

## 2013-08-19 DIAGNOSIS — F411 Generalized anxiety disorder: Secondary | ICD-10-CM | POA: Diagnosis present

## 2013-08-19 DIAGNOSIS — B373 Candidiasis of vulva and vagina: Secondary | ICD-10-CM | POA: Diagnosis present

## 2013-08-19 DIAGNOSIS — B3731 Acute candidiasis of vulva and vagina: Secondary | ICD-10-CM | POA: Diagnosis present

## 2013-08-19 LAB — CBC
HCT: 36.4 % (ref 36.0–46.0)
Hemoglobin: 12.1 g/dL (ref 12.0–15.0)
MCH: 26.1 pg (ref 26.0–34.0)
MCHC: 33.2 g/dL (ref 30.0–36.0)
MCV: 78.4 fL (ref 78.0–100.0)
PLATELETS: 256 10*3/uL (ref 150–400)
RBC: 4.64 MIL/uL (ref 3.87–5.11)
RDW: 14.9 % (ref 11.5–15.5)
WBC: 5.9 10*3/uL (ref 4.0–10.5)

## 2013-08-19 MED ORDER — SULFAMETHOXAZOLE-TMP DS 800-160 MG PO TABS: 1.0000 | ORAL_TABLET | Freq: Two times a day (BID) | ORAL | Status: DC

## 2013-08-19 MED ORDER — CIPROFLOXACIN HCL 500 MG PO TABS: 750.0000 mg | ORAL_TABLET | Freq: Two times a day (BID) | ORAL | Status: AC

## 2013-08-19 MED ORDER — HYDROCODONE-ACETAMINOPHEN 5-325 MG PO TABS: 2.0000 | ORAL_TABLET | ORAL | Status: DC | PRN

## 2013-08-19 MED ORDER — CIPROFLOXACIN HCL 750 MG PO TABS: 750.0000 mg | ORAL_TABLET | Freq: Two times a day (BID) | ORAL | Status: DC

## 2013-08-19 MED ORDER — HYDROCODONE-ACETAMINOPHEN 5-325 MG PO TABS
1.0000 | ORAL_TABLET | ORAL | Status: DC | PRN
  Administered 2013-08-19: 1 via ORAL
  Filled 2013-08-19: qty 1

## 2013-08-19 MED ORDER — AMOXICILLIN 875 MG PO TABS: 875.0000 mg | ORAL_TABLET | Freq: Three times a day (TID) | ORAL | Status: DC
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
## 2013-08-19 NOTE — Progress Notes (Signed)
Seen patient with team. Some relief with vanco IV. We will add gram negative antibiotics to broaden coverage upon discharge - Bactrim and Cipro. The patient will be [facility] for further reassessment. I have discussed this patient with my IM team and I agree with their management.

## 2013-08-19 NOTE — Discharge Instructions (Addendum)
Thank you for allowing us to be involved in your healthcare while you were hospitalized at [facility].   Please note that there have been changes to your home medications.  --> PLEASE LOOK AT YOUR DISCHARGE MEDICATION LIST FOR DETAILS.  Please be sure to take all your medications as prescribed  Please be sure to follow up in the clinic on Wednesday  Please call [phone] if you have any questions or concerns, or any difficulty getting any of your medications. Please also call if you can not afford your medications.   Please return to the ER if you have worsening of your symptoms or new severe symptoms arise.   Cellulitis Cellulitis is an infection of the skin and the tissue under the skin. The infected area is usually red and tender. This happens most often in the arms and lower legs. HOME CARE  Take your antibiotic medicine as told. Finish the medicine even if you start to feel better. Keep the infected arm or leg raised (elevated). Put a warm cloth on the area up to 4 times per day. Only take medicines as told by your doctor. Keep all doctor visits as told. GET HELP IF: You see red streaks on the skin coming from the infected area. Your red area gets bigger or turns a dark color. Your bone or joint under the infected area is painful after the skin heals. Your infection comes back in the same area or different area. You have a puffy (swollen) bump in the infected area. You have new symptoms. You have a fever. GET HELP RIGHT AWAY IF:  You feel very sleepy. You throw up (vomit) or have watery poop (diarrhea). You feel sick and have muscle aches and pains. MAKE SURE YOU:  Understand these instructions. Will watch your condition. Will get help right away if you are not doing well or get worse. Document Released: 06/08/2007 Document Revised: 05/06/2013 Document Reviewed: 03/07/2011 [facility] Patient Information 2015 [employer] [place]. This information is not  intended to replace advice given to you by your health care provider. Make sure you discuss any questions you have with your health care provider. Sulfamethoxazole; Trimethoprim, SMX-TMP tablets What is this medicine? SULFAMETHOXAZOLE; TRIMETHOPRIM or SMX-TMP (suhl fuh meth OK suh zohl; trye METH oh prim) is a combination of a sulfonamide antibiotic and a second antibiotic, trimethoprim. It is used to treat or prevent certain kinds of bacterial infections. It will not work for colds, flu, or other viral infections. This medicine may be used for other purposes; ask your health care provider or pharmacist if you have questions. COMMON BRAND NAME(S): Bacter-Aid DS, Bactrim, Bactrim DS, Septra, Septra DS What should I tell my health care provider before I take this medicine? They need to know if you have any of these conditions: -anemia -asthma -being treated with anticonvulsants -if you frequently drink alcohol containing drinks -kidney disease -liver disease -low level of folic acid or AOZHYQM-5-HQIONGEXBglucose-6-phosphate dehydrogenase -poor nutrition or malabsorption -porphyria -severe allergies -thyroid disorder -an unusual or allergic reaction to sulfamethoxazole, trimethoprim, sulfa drugs, other medicines, foods, dyes, or preservatives -pregnant or trying to get pregnant -breast-feeding How should I use this medicine? Take this medicine by mouth with a full glass of water. Follow the directions on the prescription label. Take your medicine at regular intervals. Do not take it more often than directed. Do not skip doses or stop your medicine early. Talk to your pediatrician regarding the use of this medicine in children. Special  care may be needed. This medicine has been used in children as young as 58 months of age. Overdosage: If you think you have taken too much of this medicine contact a poison control center or emergency room at once. NOTE: This medicine is only for you. Do not share this medicine with  others. What if I miss a dose? If you miss a dose, take it as soon as you can. If it is almost time for your next dose, take only that dose. Do not take double or extra doses. What may interact with this medicine? Do not take this medicine with any of the following medications: -aminobenzoate potassium -dofetilide -metronidazole This medicine may also interact with the following medications: -ACE inhibitors like benazepril, enalapril, lisinopril, and ramipril -birth control pills -cyclosporine -digoxin -diuretics -indomethacin -medicines for diabetes -methenamine -methotrexate -phenytoin -potassium supplements -pyrimethamine -sulfinpyrazone -tricyclic antidepressants -warfarin This list may not describe all possible interactions. Give your health care provider a list of all the medicines, herbs, non-prescription drugs, or dietary supplements you use. Also tell them if you smoke, drink alcohol, or use illegal drugs. Some items may interact with your medicine. What should I watch for while using this medicine? Tell your doctor or health care professional if your symptoms do not improve. Drink several glasses of water a day to reduce the risk of kidney problems. Do not treat diarrhea with over the counter products. Contact your doctor if you have diarrhea that lasts more than 2 days or if it is severe and watery. This medicine can make you more sensitive to the sun. Keep out of the sun. If you cannot avoid being in the sun, wear protective clothing and use a sunscreen. Do not use sun lamps or tanning beds/booths. What side effects may I notice from receiving this medicine? Side effects that you should report to your doctor or health care professional as soon as possible: -allergic reactions like skin rash or hives, swelling of the face, lips, or tongue -breathing problems -fever or chills, sore throat -irregular heartbeat, chest pain -joint or muscle pain -pain or difficulty passing  urine -red pinpoint spots on skin -redness, blistering, peeling or loosening of the skin, including inside the mouth -unusual bleeding or bruising -unusually weak or tired -yellowing of the eyes or skin Side effects that usually do not require medical attention (report to your doctor or health care professional if they continue or are bothersome): -diarrhea -dizziness -headache -loss of appetite -nausea, vomiting -nervousness This list may not describe all possible side effects. Call your doctor for medical advice about side effects. You may report side effects to FDA at 1-800-FDA-1088. Where should I keep my medicine? Keep out of the reach of children. Store at room temperature between 20 to 25 degrees C (68 to 77 degrees F). Protect from light. Throw away any unused medicine after the expiration date. NOTE: This sheet is a summary. It may not cover all possible information. If you have questions about this medicine, talk to your doctor, pharmacist, or health care provider.  2015, Elsevier/Gold Standard. (2012-07-27 14:38:26) Ciprofloxacin tablets What is this medicine? CIPROFLOXACIN (sip roe FLOX a sin) is a quinolone antibiotic. It is used to treat certain kinds of bacterial infections. It will not work for colds, flu, or other viral infections. This medicine may be used for other purposes; ask your health care provider or pharmacist if you have questions. COMMON BRAND NAME(S): Cipro What should I tell my health care provider before I take this medicine?  They need to know if you have any of these conditions: -bone problems -cerebral disease -joint problems -irregular heartbeat -kidney disease -liver disease -myasthenia gravis -seizure disorder -tendon problems -an unusual or allergic reaction to ciprofloxacin, other antibiotics or medicines, foods, dyes, or preservatives -pregnant or trying to get pregnant -breast-feeding How should I use this medicine? Take this medicine  by mouth with a glass of water. Follow the directions on the prescription label. Take your medicine at regular intervals. Do not take your medicine more often than directed. Take all of your medicine as directed even if you think your are better. Do not skip doses or stop your medicine early. You can take this medicine with food or on an empty stomach. It can be taken with a meal that contains dairy or calcium, but do not take it alone with a dairy product, like milk or yogurt or calcium-fortified juice. A special MedGuide will be given to you by the pharmacist with each prescription and refill. Be sure to read this information carefully each time. Talk to your pediatrician regarding the use of this medicine in children. Special care may be needed. Overdosage: If you think you have taken too much of this medicine contact a poison control center or emergency room at once. NOTE: This medicine is only for you. Do not share this medicine with others. What if I miss a dose? If you miss a dose, take it as soon as you can. If it is almost time for your next dose, take only that dose. Do not take double or extra doses. What may interact with this medicine? Do not take this medicine with any of the following medications: -cisapride -droperidol -terfenadine -tizanidine This medicine may also interact with the following medications: -antacids -birth control pills -caffeine -cyclosporin -didanosine (ddI) buffered tablets or powder -medicines for diabetes -medicines for inflammation like ibuprofen, naproxen -methotrexate -multivitamins -omeprazole -phenytoin -probenecid -sucralfate -theophylline -warfarin This list may not describe all possible interactions. Give your health care provider a list of all the medicines, herbs, non-prescription drugs, or dietary supplements you use. Also tell them if you smoke, drink alcohol, or use illegal drugs. Some items may interact with your medicine. What should  I watch for while using this medicine? Tell your doctor or health care professional if your symptoms do not improve. Do not treat diarrhea with over the counter products. Contact your doctor if you have diarrhea that lasts more than 2 days or if it is severe and watery. You may get drowsy or dizzy. Do not drive, use machinery, or do anything that needs mental alertness until you know how this medicine affects you. Do not stand or sit up quickly, especially if you are an older patient. This reduces the risk of dizzy or fainting spells. This medicine can make you more sensitive to the sun. Keep out of the sun. If you cannot avoid being in the sun, wear protective clothing and use sunscreen. Do not use sun lamps or tanning beds/booths. Avoid antacids, aluminum, calcium, iron, magnesium, and zinc products for 6 hours before and 2 hours after taking a dose of this medicine. What side effects may I notice from receiving this medicine? Side effects that you should report to your doctor or health care professional as soon as possible: - allergic reactions like skin rash, itching or hives, swelling of the face, lips, or tongue - breathing problems - confusion, nightmares or hallucinations - feeling faint or lightheaded, falls - irregular heartbeat - joint, muscle or  tendon pain or swelling - pain or trouble passing urine -persistent headache with or without blurred vision - redness, blistering, peeling or loosening of the skin, including inside the mouth - seizure - unusual pain, numbness, tingling, or weakness Side effects that usually do not require medical attention (report to your doctor or health care professional if they continue or are bothersome): - diarrhea - nausea or stomach upset - white patches or sores in the mouth This list may not describe all possible side effects. Call your doctor for medical advice about side effects. You may report side effects to FDA at  1-800-FDA-1088. Where should I keep my medicine? Keep out of the reach of children. Store at room temperature below 30 degrees C (86 degrees F). Keep container tightly closed. Throw away any unused medicine after the expiration date. NOTE: This sheet is a summary. It may not cover all possible information. If you have questions about this medicine, talk to your doctor, pharmacist, or health care provider.  2015, Elsevier/Gold Standard. (2012-07-26 16:10:46)

## 2013-08-19 NOTE — Progress Notes (Signed)
IV was removed this morning due to infiltration.  MD made aware, but wanted IV restarted for one more dose of Vancomycin.  RN unable to obtain new IV site, IV team attempted unsuccessfully with ultrasound, and then patient refused additional restart attempts.  Dr. Aliene AltesByrne made aware.

## 2013-08-19 NOTE — Progress Notes (Signed)
Subjective: Overall, she is concerned that her cellulitis has not been improving. She endorses moderate neck pain overnight and was given cyclobenzaprine 5mg , which helped improve her pain. She has also been anxious about leaving her four children at home and the lack of improvement and was given alprazolam 0.25 mg last night. Had one bowel movement yesterday morning with moderate improvement in her abdominal discomfort. She is not currently breastfeeding, and her youngest child is 44 years old. Denies headaches or fevers.   Objective: Vital signs in last 24 hours: Filed Vitals:   08/18/13 0521 08/18/13 1515 08/18/13 2128 08/19/13 0609  BP: 122/85 118/70 121/73 108/56  Pulse: 82 72 75 61  Temp: 97.8 F (36.6 C) 97.9 F (36.6 C) 98.9 F (37.2 C) 97.5 F (36.4 C)  TempSrc:  Oral Oral   Resp: 18 18 18 18   Height:      Weight:      SpO2: 98% 100% 100% 100%   Weight change:   Intake/Output Summary (Last 24 hours) at 08/19/13 0742 Last data filed at 08/18/13 1740  Gross per 24 hour  Intake    880 ml  Output      0 ml  Net    880 ml   BP 108/56  Pulse 61  Temp(Src) 97.5 F (36.4 C) (Oral)  Resp 18  Ht 5\' 6"  (1.676 m)  Wt 106.34 kg (234 lb 7 oz)  BMI 37.86 kg/m2  SpO2 100%  LMP 07/27/2013 General appearance: alert, cooperative and no distress  Neck: 8x5 cm mildly erythematous, indurated, raised, non-draining lesion in left posterolateral neck at the hairline  Heart: regular rate and rhythm, S1, S2 normal, no murmur, click, rub or gallop  Abdomen: soft, bowel sounds normal, mild tenderness to palpation in upper quadrants   Lab Results: Basic Metabolic Panel:  Recent Labs Lab 08/14/13 2137 08/17/13 0557 08/18/13 0446  NA 137 138 139  K 3.8 3.5* 3.9  CL 102 103 105  CO2 19  --  22  GLUCOSE 77 91 104*  BUN 9 10 6   CREATININE 0.65 0.70 0.61  CALCIUM 8.9  --  9.0   Liver Function Tests:  Recent Labs Lab 08/18/13 0446  AST 19  ALT 13  ALKPHOS 61  BILITOT 0.4    PROT 6.6  ALBUMIN 2.9*   CBC:  Recent Labs Lab 08/14/13 2137 08/17/13 0540 08/17/13 0557 08/18/13 0446  WBC 14.2* 10.7*  --  6.7  NEUTROABS 10.3* 6.2  --   --   HGB 12.0 12.2 13.9 10.3*  HCT 36.4 36.2 41.0 31.0*  MCV 79.8 77.2*  --  78.3  PLT 291 309  --  266   Urinalysis:  Recent Labs Lab 08/17/13 0837  COLORURINE YELLOW  LABSPEC 1.025  PHURINE 6.0  GLUCOSEU NEGATIVE  HGBUR NEGATIVE  BILIRUBINUR NEGATIVE  KETONESUR 15*  PROTEINUR NEGATIVE  UROBILINOGEN 0.2  NITRITE NEGATIVE  LEUKOCYTESUR NEGATIVE   Micro Results: Recent Results (from the past 240 hour(s))  CULTURE, BLOOD (ROUTINE X 2)     Status: None   Collection Time    08/17/13  8:45 AM      Result Value Ref Range Status   Specimen Description BLOOD RIGHT ARM   Final   Special Requests BOTTLES DRAWN AEROBIC AND ANAEROBIC 10CC   Final   Culture  Setup Time     Final   Value: 08/17/2013 18:00     Performed at [facility]   Culture     Final  Value:        BLOOD CULTURE RECEIVED NO GROWTH TO DATE CULTURE WILL BE HELD FOR 5 DAYS BEFORE ISSUING A FINAL NEGATIVE REPORT     Performed at [facility]   Report Status PENDING   Incomplete  CULTURE, BLOOD (ROUTINE X 2)     Status: None   Collection Time    08/17/13  8:55 AM      Result Value Ref Range Status   Specimen Description BLOOD RIGHT HAND   Final   Special Requests BOTTLES DRAWN AEROBIC ONLY 10CC   Final   Culture  Setup Time     Final   Value: 08/17/2013 18:00     Performed at [facility]   Culture     Final   Value:        BLOOD CULTURE RECEIVED NO GROWTH TO DATE CULTURE WILL BE HELD FOR 5 DAYS BEFORE ISSUING A FINAL NEGATIVE REPORT     Performed at [facility]   Report Status PENDING   Incomplete   Studies/Results: US Soft Tissue Head/neck  08/17/2013   CLINICAL DATA:  Possible left neck mass  EXAM: ULTRASOUND OF HEAD/NECK SOFT TISSUES  TECHNIQUE: Ultrasound examination of the head and neck soft tissues was  performed in the area of clinical concern.  COMPARISON:  CT, 08/14/2013  FINDINGS: No sonographic mass. No fluid collection. No sonographic abnormality.  IMPRESSION: Negative exam.  No evidence of an abscess or mass.   Electronically Signed   By: Amie Portland M.D.   On: 08/17/2013 08:02   Medications:  Scheduled Meds: . enoxaparin (LOVENOX) injection  40 mg Subcutaneous Q24H  . nicotine  21 mg Transdermal Daily  . vancomycin  1,000 mg Intravenous Q8H   Continuous Infusions:  PRN Meds:.ketorolac, senna   Assessment/Plan: Active Problems:   Cellulitis, neck  Sue Clayton is a 28 y.o. female with PMH of anxiety, depression, and h/o nephrolithiasis and cholelithiasis who presented with a non-purulent cellulitis in the left posterolateral neck.   Neck cellulitis, nonpurulent: Non-purulent, mildly erythematous lesion in the left posterolateral neck unchanged on exam today compared to prior exams. She has received six doses (including loading dose) of appropriately dosed IV Vancomycin since being admitted. Pt had inadequate clinical response to 3 days of clindamycin therapy before admission. No abscess present on U/S or prior CT. No signs or symptoms of systemic infection. BCx NGTD. Most likely Staph or Strep infection and has no major risk factor for MRSA infection.  Plan.  - Continue IV vancomycin 1 g IV q8 hours  - Consider switching to PO Abx, such as Amoxicillin + TMP/SMX or Amoxicillin + Doxycycline, with early follow-up in the Internal Medicine Clinic - F/u BCx  - Received one dose of Cyclobenzaprine 5mg  PO yesterday - Toradol PRN for pain  - Zofran PRN for nausea   Vaginal Candidiasis: Resolved. U/A negative for nitrites and LE.  - Received Diflucan 150 mg PO  Tobacco use: Smokes 1 ppd at home.  - Nicotine 21 mg patch daily   Anxiety: Endorsed feeling anxious about her social situation and lack of improvement over the past few days since starting IV vancyomycin - Received one dose  of alprazolam 0.25 mg PO  FEN: Regular diet  - Replete electrolytes, as necessary  - Senna 8.6 mg PO BID PRN for mild constipation   This is a Psychologist, occupational Note.  The care of the patient was discussed with Dr. Zada Girt and the assessment and plan  formulated with their assistance. Please see their attached note for official documentation of the daily encounter.   LOS: 2 days   Foye ClockJames D Kylyn Sookram, Med Student 08/19/2013, 7:42 AM

## 2013-08-19 NOTE — Progress Notes (Signed)
Pryor MontesLoushana C Grill to be D/C'd Home per MD order.  Discussed with the patient and all questions fully answered.  VSS, Skin clean, dry and intact without evidence of skin break down, no evidence of skin tears noted. IV catheter discontinued intact. Site without signs and symptoms of complications. Dressing and pressure applied.  An After Visit Summary was printed and given to the patient. Patient has prescription for pain medication.  D/c education completed with patient/family including follow up instructions, medication list, d/c activities limitations if indicated, with other d/c instructions as indicated by MD - patient able to verbalize understanding, all questions fully answered.   Patient instructed to return to ED, call 911, or call MD for any changes in condition.   Patient escorted via WC, and D/C home via private auto.  Burt EkCook, Arletta Lumadue D 08/19/2013 12:56 PM

## 2013-08-19 NOTE — Progress Notes (Addendum)
I have seen the patient and reviewed the daily progress note by Aliene AltesByrne MS IV and discussed the care of the patient with them.  Please see note for my findings, assessment, and plans/additions.   Subjective: She reports pain this am. She is tearful because she has 4 kids at home and hopes to return home today to take care of them. She is worried about them. She require Xanax last night for anxiety.  No fevers overnight  This is day 3 of Vanc   Objective: Vital signs in last 24 hours: Filed Vitals:   08/18/13 0521 08/18/13 1515 08/18/13 2128 08/19/13 0609  BP: 122/85 118/70 121/73 108/56  Pulse: 82 72 75 61  Temp: 97.8 F (36.6 C) 97.9 F (36.6 C) 98.9 F (37.2 C) 97.5 F (36.4 C)  TempSrc:  Oral Oral   Resp: 18 18 18 18   Height:      Weight:      SpO2: 98% 100% 100% 100%   Weight change:   Intake/Output Summary (Last 24 hours) at 08/19/13 0834 Last data filed at 08/18/13 1740  Gross per 24 hour  Intake    880 ml  Output      0 ml  Net    880 ml    General: Vital signs reviewed. Tearful in pain.   Lungs: Clear to auscultation bilaterally  Neck: There is 8x5 cm mildly erythematous, warm, hard, raised, non-draining lesion in left posterolateral neck proximal and involving the hairline; resolution of <1cm lesion on right posterolateral neck. Clinically exam has not changed much since admission  Heart: RRR; no extra sounds or murmurs  Abdomen: Bowel sounds present, soft, nontender; no hepatosplenomegaly  Extremities: No bilateral ankle edema  Neurologic: Alert and oriented x3. Moves all extremities  Lab Results: Basic Metabolic Panel:  Recent Labs Lab 08/14/13 2137 08/17/13 0557 08/18/13 0446  NA 137 138 139  K 3.8 3.5* 3.9  CL 102 103 105  CO2 19  --  22  GLUCOSE 77 91 104*  BUN 9 10 6   CREATININE 0.65 0.70 0.61  CALCIUM 8.9  --  9.0   Liver Function Tests:  Recent Labs Lab 08/18/13 0446  AST 19  ALT 13  ALKPHOS 61  BILITOT 0.4  PROT 6.6  ALBUMIN 2.9*    CBC:  Recent Labs Lab 08/14/13 2137 08/17/13 0540  08/18/13 0446 08/19/13 0659  WBC 14.2* 10.7*  --  6.7 5.9  NEUTROABS 10.3* 6.2  --   --   --   HGB 12.0 12.2  < > 10.3* 12.1  HCT 36.4 36.2  < > 31.0* 36.4  MCV 79.8 77.2*  --  78.3 78.4  PLT 291 309  --  266 256  < > = values in this interval not displayed. Urinalysis:  Recent Labs Lab 08/17/13 0837  COLORURINE YELLOW  LABSPEC 1.025  PHURINE 6.0  GLUCOSEU NEGATIVE  HGBUR NEGATIVE  BILIRUBINUR NEGATIVE  KETONESUR 15*  PROTEINUR NEGATIVE  UROBILINOGEN 0.2  NITRITE NEGATIVE  LEUKOCYTESUR NEGATIVE   Micro Results: Recent Results (from the past 240 hour(s))  CULTURE, BLOOD (ROUTINE X 2)     Status: None   Collection Time    08/17/13  8:45 AM      Result Value Ref Range Status   Specimen Description BLOOD RIGHT ARM   Final   Special Requests BOTTLES DRAWN AEROBIC AND ANAEROBIC 10CC   Final   Culture  Setup Time     Final   Value: 08/17/2013 18:00  Performed at [facility]     Final   Value:        BLOOD CULTURE RECEIVED NO GROWTH TO DATE CULTURE WILL BE HELD FOR 5 DAYS BEFORE ISSUING A FINAL NEGATIVE REPORT     Performed at [facility]   Report Status PENDING   Incomplete  CULTURE, BLOOD (ROUTINE X 2)     Status: None   Collection Time    08/17/13  8:55 AM      Result Value Ref Range Status   Specimen Description BLOOD RIGHT HAND   Final   Special Requests BOTTLES DRAWN AEROBIC ONLY 10CC   Final   Culture  Setup Time     Final   Value: 08/17/2013 18:00     Performed at [facility]   Culture     Final   Value:        BLOOD CULTURE RECEIVED NO GROWTH TO DATE CULTURE WILL BE HELD FOR 5 DAYS BEFORE ISSUING A FINAL NEGATIVE REPORT     Performed at [facility]   Report Status PENDING   Incomplete   Studies/Results: No results found. Medications: I have reviewed the patient's current medications. Scheduled Meds: . enoxaparin (LOVENOX) injection  40 mg  Subcutaneous Q24H  . nicotine  21 mg Transdermal Daily  . vancomycin  1,000 mg Intravenous Q8H   Continuous Infusions:  PRN Meds:.HYDROcodone-acetaminophen, ketorolac, senna Assessment/Plan: Active Problems:   Cellulitis, neck   Neck cellulitis, nonpurulent: Poorly improved since admission. Non-purulent, mildly erythematous lesion in the left posterolateral neck that has improved subjectively. Failed outpatient antibiotics with PO clindamycin. No abscess present on U/S or prior CT. No signs or symptoms of systemic infection. Most likely staph especially community acquire MRSA or Strep infection and has no major risk factor for MRSA infection.  Plan.  - will give another dose of IV vancomycin 1 g IV q8 hours. This is day 3. Will aim for total of 10 days treatment  - Switch to PO cipro and bactrim. End date 08/27/2013 - Will get a clinic appointment in 2 days to evaluate for improvement. She might require re-imaging to rule out abscess. No evidence of pus today. - BCx no growth  - discharge with Norco for pain management  - Zofran PRN for nausea   Vaginal Candidiasis: Symptoms concerning for yeast infection. U/A negative for nitrites and LE.  - Diflucan 150 mg once for possible vaginal candidiasis   Tobacco use: Smokes 1 ppd at home.  - Nicotine 21 mg patch daily  Anxiety: Not medically treated at this time.  - Consider outpatient treatment   FEN: regular diet  - Replete electrolytes, as necessary  - Senna 8.6 mg PO BID PRN for mild constipation   DVT PPx:  - Lovenox subcutaneous  Disposition: discharge her home today with a follow up appointment on Wednesday at [facility].    LOS: 2 days   Dow Adolph PGY 3 - Internal Medicine Teaching Service Pager: [phone] 08/19/2013, 8:34 AM

## 2013-08-21 ENCOUNTER — Ambulatory Visit: Payer: Medicaid Other | Admitting: Internal Medicine

## 2013-08-21 ENCOUNTER — Encounter: Payer: Self-pay | Admitting: General Practice

## 2013-08-23 LAB — CULTURE, BLOOD (ROUTINE X 2)
CULTURE: NO GROWTH
Culture: NO GROWTH

## 2013-08-25 NOTE — ED Provider Notes (Signed)
Medical screening examination/treatment/procedure(s) were performed by non-physician practitioner and as supervising physician I was immediately available for consultation/collaboration.   EKG Interpretation None        Julie Manly, MD 08/25/13 0751 

## 2013-08-29 NOTE — ED Provider Notes (Signed)
CSN: [id number]     Arrival date & time 08/17/13  0236 History   First MD Initiated Contact with Patient 08/17/13 [phone]     Chief Complaint  Patient presents with  . Headache     (Consider location/radiation/quality/duration/timing/severity/associated sxs/prior Treatment) HPI Comments: The pt is c/o pain in the back of her neck  She was seen here for the same 3-4 days ago and placed on antibiotics.  The history is provided by the patient.    Past Medical History  Diagnosis Date  . Anxiety   . Mental disorder   . Depression    Past Surgical History  Procedure Laterality Date  . Cesarean section    . Lithotripsy     History reviewed. No pertinent family history. History  Substance Use Topics  . Smoking status: Current Every Day Smoker -- 1.00 packs/day    Types: Cigarettes  . Smokeless tobacco: Not on file  . Alcohol Use: No   OB History   Grav Para Term Preterm Abortions TAB SAB Ect Mult Living   Review of Systems  Constitutional: Negative for fever.  Musculoskeletal: Positive for myalgias.  Skin: Positive for wound.  Neurological: Negative for dizziness and headaches.  All other systems reviewed and are negative.     Allergies  Review of patient's allergies indicates no known allergies.  Home Medications   Prior to Admission medications   Medication Sig Start Date End Date Taking? Authorizing Provider  acetaminophen-codeine (TYLENOL #3) 300-30 MG per tablet Take 1 tablet by mouth every 4 (four) hours as needed for moderate pain.   Yes Historical Provider, MD  ibuprofen (ADVIL,MOTRIN) 200 MG tablet Take 400 mg by mouth every 6 (six) hours as needed for headache or moderate pain.   Yes Historical Provider, MD  HYDROcodone-acetaminophen (NORCO) 5-325 MG per tablet Take 2 tablets by mouth every 4 (four) hours as needed. 08/19/13   Dow Adolph, MD  sulfamethoxazole-trimethoprim (BACTRIM DS) 800-160 MG per tablet Take 1 tablet by mouth 2  (two) times daily. 08/19/13   Dow Adolph, MD   BP 108/56  Pulse 61  Temp(Src) 97.5 F (36.4 C) (Oral)  Resp 18  Ht  (1.676 m)  Wt 234 lb 7 oz (106.34 kg)  BMI 37.86 kg/m2  SpO2 100%  LMP 07/27/2013 Physical Exam  Nursing note and vitals reviewed. Constitutional: She is oriented to person, place, and time. She appears well-developed and well-nourished.  HENT:  Head: Normocephalic.  Eyes: Pupils are equal, round, and reactive to light.  Neck:  Large abscess back of necy started on antibiotics 3 days worsening now pain with moevment of neck  Cardiovascular: Normal rate.   Pulmonary/Chest: Effort normal.  Neurological: She is alert and oriented to person, place, and time.  Skin: Skin is warm.    ED Course  Procedures (including critical care time) Labs Review Labs Reviewed  CBC WITH DIFFERENTIAL - Abnormal; Notable for the following:    WBC 10.7 (*)    MCV 77.2 (*)    All other components within normal limits  URINALYSIS, ROUTINE W REFLEX MICROSCOPIC - Abnormal; Notable for the following:    Ketones, ur 15 (*)    All other components within normal limits  COMPREHENSIVE METABOLIC PANEL - Abnormal; Notable for the following:    Glucose, Bld 104 (*)    Albumin 2.9 (*)    All other components within normal limits  CBC -  Abnormal; Notable for the following:    Hemoglobin 10.3 (*)    HCT 31.0 (*)    All other components within normal limits  I-STAT CHEM 8, ED - Abnormal; Notable for the following:    Potassium 3.5 (*)    All other components within normal limits  CULTURE, BLOOD (ROUTINE X 2)  CULTURE, BLOOD (ROUTINE X 2)  PREGNANCY, URINE  CBC  I-STAT CG4 LACTIC ACID, ED    Imaging Review No results found.   EKG Interpretation None      MDM   Final diagnoses:  Cellulitis of neck    Worsening abscess will order labs and ultrasound Signed over to oncoming staff    Arman Filter, NP 08/29/13 2019

## 2013-09-07 NOTE — ED Provider Notes (Signed)
Medical screening examination/treatment/procedure(s) were performed by non-physician practitioner and as supervising physician I was immediately available for consultation/collaboration.   EKG Interpretation None        Julie Manly, MD 09/07/13 0717 

## 2013-09-19 ENCOUNTER — Encounter (HOSPITAL_COMMUNITY): Payer: Self-pay

## 2013-09-19 ENCOUNTER — Inpatient Hospital Stay (HOSPITAL_COMMUNITY)
Admission: AD | Admit: 2013-09-19 | Discharge: 2013-09-19 | Disposition: A | Discharge status: Home / Self Care | Payer: Medicaid Other | Source: Ambulatory Visit | Attending: Obstetrics & Gynecology | Admitting: Obstetrics & Gynecology

## 2013-09-19 DIAGNOSIS — N760 Acute vaginitis: Secondary | ICD-10-CM | POA: Insufficient documentation

## 2013-09-19 DIAGNOSIS — B9689 Other specified bacterial agents as the cause of diseases classified elsewhere: Secondary | ICD-10-CM | POA: Diagnosis not present

## 2013-09-19 DIAGNOSIS — N898 Other specified noninflammatory disorders of vagina: Secondary | ICD-10-CM | POA: Diagnosis present

## 2013-09-19 DIAGNOSIS — A499 Bacterial infection, unspecified: Secondary | ICD-10-CM | POA: Diagnosis not present

## 2013-09-19 DIAGNOSIS — Z3202 Encounter for pregnancy test, result negative: Secondary | ICD-10-CM | POA: Diagnosis not present

## 2013-09-19 LAB — URINALYSIS, ROUTINE W REFLEX MICROSCOPIC
Bilirubin Urine: NEGATIVE
GLUCOSE, UA: NEGATIVE mg/dL
HGB URINE DIPSTICK: NEGATIVE
KETONES UR: NEGATIVE mg/dL
LEUKOCYTES UA: NEGATIVE
Nitrite: NEGATIVE
Protein, ur: NEGATIVE mg/dL
Specific Gravity, Urine: 1.02 (ref 1.005–1.030)
Urobilinogen, UA: 0.2 mg/dL (ref 0.0–1.0)
pH: 7 (ref 5.0–8.0)

## 2013-09-19 LAB — WET PREP, GENITAL
TRICH WET PREP: NONE SEEN
WBC WET PREP: NONE SEEN
YEAST WET PREP: NONE SEEN

## 2013-09-19 LAB — HIV ANTIBODY (ROUTINE TESTING W REFLEX): HIV 1&2 Ab, 4th Generation: NONREACTIVE

## 2013-09-19 MED ORDER — METRONIDAZOLE 500 MG PO TABS: 500.0000 mg | ORAL_TABLET | Freq: Two times a day (BID) | ORAL | Status: DC

## 2013-09-19 NOTE — MAU Note (Signed)
Pt reports she is 22 days late for her period. Negative pregnancy test x2. Pt also c.o nausea.

## 2013-09-19 NOTE — MAU Provider Note (Signed)
History     CSN: [id number]  Arrival date and time: 09/19/13 1005   First Provider Initiated Contact with Patient 09/19/13 1110      Chief Complaint  Patient presents with  . Possible Pregnancy   HPI [name] is a 28 y.o. [phone] who presents to MAU today with complaint of vaginal discharge and amenorrhea. The patient states a history of 45 day cycles, but states that she is now ~ 7 days late from that. She has had 2 negative pregnancy tests. She has also had nausea without vomiting or diarrhea. She states occasional LLQ pain that has been presents off and on x months and was previously evaluated. She states occasional constipation as well. She denies fever, UTI symptoms or vaginal bleeding.   OB History   Grav Para Term Preterm Abortions TAB SAB Ect Mult Living   Past Medical History  Diagnosis Date  . Anxiety   . Mental disorder   . Depression     Past Surgical History  Procedure Laterality Date  . Cesarean section    . Lithotripsy      History reviewed. No pertinent family history.  History  Substance Use Topics  . Smoking status: Current Every Day Smoker -- 1.00 packs/day    Types: Cigarettes  . Smokeless tobacco: Not on file  . Alcohol Use: No    Allergies: No Known Allergies  No prescriptions prior to admission    Review of Systems  Constitutional: Negative for fever and malaise/fatigue.  Gastrointestinal: Positive for nausea, abdominal pain and constipation. Negative for vomiting and diarrhea.  Genitourinary: Negative for dysuria, urgency and frequency.       Neg - vaginal bleeding + vaginal discharge   Physical Exam   Blood pressure 116/76, pulse 79, temperature 98.9 F (37.2 C), temperature source Oral, resp. rate 18, height  (1.702 m), weight 237 lb 3.2 oz (107.593 kg), last menstrual period 08/27/2013.  Physical Exam  Constitutional: She is oriented to person, place, and time. She appears  well-developed and well-nourished. No distress.  HENT:  Head: Normocephalic.  Cardiovascular: Normal rate.   Respiratory: Effort normal.  GI: Soft. She exhibits no distension and no mass. There is no tenderness. There is no rebound and no guarding.  Genitourinary: Uterus is not enlarged and not tender. Cervix exhibits no motion tenderness, no discharge and no friability. Right adnexum displays no mass and no tenderness. Left adnexum displays no mass and no tenderness. No bleeding around the vagina. Vaginal discharge (small amount of thin, white discharge noted) found.  Neurological: She is alert and oriented to person, place, and time.  Skin: Skin is warm and dry. No erythema.  Psychiatric: She has a normal mood and affect.   Results for orders placed during the hospital encounter of 09/19/13 (from the past 24 hour(s))  URINALYSIS, ROUTINE W REFLEX MICROSCOPIC     Status: None   Collection Time    09/19/13 10:15 AM      Result Value Ref Range   Color, Urine YELLOW  YELLOW   APPearance CLEAR  CLEAR   Specific Gravity, Urine 1.020  1.005 - 1.030   pH 7.0  5.0 - 8.0   Glucose, UA NEGATIVE  NEGATIVE mg/dL   Hgb urine dipstick NEGATIVE  NEGATIVE   Bilirubin Urine NEGATIVE  NEGATIVE   Ketones, ur NEGATIVE  NEGATIVE mg/dL   Protein, ur NEGATIVE  NEGATIVE  mg/dL   Urobilinogen, UA 0.2  0.0 - 1.0 mg/dL   Nitrite NEGATIVE  NEGATIVE   Leukocytes, UA NEGATIVE  NEGATIVE  WET PREP, GENITAL     Status: Abnormal   Collection Time    09/19/13 11:15 AM      Result Value Ref Range   Yeast Wet Prep HPF POC NONE SEEN  NONE SEEN   Trich, Wet Prep NONE SEEN  NONE SEEN   Clue Cells Wet Prep HPF POC FEW (*) NONE SEEN   WBC, Wet Prep HPF POC NONE SEEN  NONE SEEN    MAU Course  Procedures None  MDM UPT - negative UA, wet prep, GC/Chlamydia and HIV today  Assessment and Plan  A: Bacterial vaginosis Negative pregnancy test  P: Disharge home Rx for Flagyl sent to patient's pharmacy Patient  advised to take HPT in ~2 weeks and follow-up as indicated. WOC contact information given Patient may return to MAU as needed or if her condition were to change or worsen  Marny Lowenstein, PA-C  09/19/2013, 12:00 PM

## 2013-09-19 NOTE — Discharge Instructions (Signed)
Bacterial Vaginosis Bacterial vaginosis is a vaginal infection that occurs when the normal balance of bacteria in the vagina is disrupted. It results from an overgrowth of certain bacteria. This is the most common vaginal infection in women of childbearing age. Treatment is important to prevent complications, especially in pregnant women, as it can cause a premature delivery. CAUSES  Bacterial vaginosis is caused by an increase in harmful bacteria that are normally present in smaller amounts in the vagina. Several different kinds of bacteria can cause bacterial vaginosis. However, the reason that the condition develops is not fully understood. RISK FACTORS Certain activities or behaviors can put you at an increased risk of developing bacterial vaginosis, including:  Having a new sex partner or multiple sex partners.  Douching.  Using an intrauterine device (IUD) for contraception. Women do not get bacterial vaginosis from toilet seats, bedding, swimming pools, or contact with objects around them. SIGNS AND SYMPTOMS  Some women with bacterial vaginosis have no signs or symptoms. Common symptoms include:  Grey vaginal discharge.  A fishlike odor with discharge, especially after sexual intercourse.  Itching or burning of the vagina and vulva.  Burning or pain with urination. DIAGNOSIS  Your health care provider will take a medical history and examine the vagina for signs of bacterial vaginosis. A sample of vaginal fluid may be taken. Your health care provider will look at this sample under a microscope to check for bacteria and abnormal cells. A vaginal pH test may also be done.  TREATMENT  Bacterial vaginosis may be treated with antibiotic medicines. These may be given in the form of a pill or a vaginal cream. A second round of antibiotics may be prescribed if the condition comes back after treatment.  HOME CARE INSTRUCTIONS   Only take over-the-counter or prescription medicines as  directed by your health care provider.  If antibiotic medicine was prescribed, take it as directed. Make sure you finish it even if you start to feel better.  Do not have sex until treatment is completed.  Tell all sexual partners that you have a vaginal infection. They should see their health care provider and be treated if they have problems, such as a mild rash or itching.  Practice safe sex by using condoms and only having one sex partner. SEEK MEDICAL CARE IF:   Your symptoms are not improving after 3 days of treatment.  You have increased discharge or pain.  You have a fever. MAKE SURE YOU:   Understand these instructions.  Will watch your condition.  Will get help right away if you are not doing well or get worse. FOR MORE INFORMATION  Centers for Disease Control and Prevention, Division of STD Prevention: www.cdc.gov/std American Sexual Health Association (ASHA): www.ashastd.org  Document Released: 12/20/2004 Document Revised: 10/10/2012 Document Reviewed: 08/01/2012 ExitCare Patient Information 2015 [employer]. This information is not intended to replace advice given to you by your health care provider. Make sure you discuss any questions you have with your health care provider.  

## 2013-09-19 NOTE — [facility] Provider Note (Signed)
Attestation of Attending Supervision of Advanced Practitioner (CNM/NP): Evaluation and management procedures were performed by the Advanced Practitioner under my supervision and collaboration.  I have reviewed the Advanced Practitioner's note and chart, and I agree with the management and plan.  HARRAWAY-SMITH, Joshuajames Moehring 5:08 PM

## 2013-09-20 LAB — GC/CHLAMYDIA PROBE AMP
CT Probe RNA: NEGATIVE
GC Probe RNA: NEGATIVE

## 2013-11-04 ENCOUNTER — Encounter (HOSPITAL_COMMUNITY): Payer: Self-pay

## 2014-01-04 ENCOUNTER — Inpatient Hospital Stay (HOSPITAL_COMMUNITY): Payer: Medicaid Other

## 2014-01-04 ENCOUNTER — Inpatient Hospital Stay (HOSPITAL_COMMUNITY)
Admission: AD | Admit: 2014-01-04 | Discharge: 2014-01-04 | Disposition: A | Discharge status: Home / Self Care | Payer: Medicaid Other | Source: Ambulatory Visit | Attending: Obstetrics and Gynecology | Admitting: Obstetrics and Gynecology

## 2014-01-04 ENCOUNTER — Encounter (HOSPITAL_COMMUNITY): Payer: Self-pay | Admitting: *Deleted

## 2014-01-04 DIAGNOSIS — F17210 Nicotine dependence, cigarettes, uncomplicated: Secondary | ICD-10-CM | POA: Diagnosis not present

## 2014-01-04 DIAGNOSIS — Z3490 Encounter for supervision of normal pregnancy, unspecified, unspecified trimester: Secondary | ICD-10-CM

## 2014-01-04 DIAGNOSIS — N949 Unspecified condition associated with female genital organs and menstrual cycle: Secondary | ICD-10-CM | POA: Diagnosis present

## 2014-01-04 DIAGNOSIS — O210 Mild hyperemesis gravidarum: Secondary | ICD-10-CM | POA: Insufficient documentation

## 2014-01-04 DIAGNOSIS — F17200 Nicotine dependence, unspecified, uncomplicated: Secondary | ICD-10-CM | POA: Diagnosis present

## 2014-01-04 DIAGNOSIS — O26899 Other specified pregnancy related conditions, unspecified trimester: Secondary | ICD-10-CM

## 2014-01-04 DIAGNOSIS — O99331 Smoking (tobacco) complicating pregnancy, first trimester: Secondary | ICD-10-CM | POA: Insufficient documentation

## 2014-01-04 DIAGNOSIS — Z3A08 8 weeks gestation of pregnancy: Secondary | ICD-10-CM | POA: Insufficient documentation

## 2014-01-04 DIAGNOSIS — R52 Pain, unspecified: Secondary | ICD-10-CM

## 2014-01-04 DIAGNOSIS — R109 Unspecified abdominal pain: Secondary | ICD-10-CM | POA: Insufficient documentation

## 2014-01-04 DIAGNOSIS — O9989 Other specified diseases and conditions complicating pregnancy, childbirth and the puerperium: Secondary | ICD-10-CM

## 2014-01-04 LAB — CBC
HEMATOCRIT: 35.8 % — AB (ref 36.0–46.0)
Hemoglobin: 12 g/dL (ref 12.0–15.0)
MCH: 26.3 pg (ref 26.0–34.0)
MCHC: 33.5 g/dL (ref 30.0–36.0)
MCV: 78.3 fL (ref 78.0–100.0)
PLATELETS: 236 10*3/uL (ref 150–400)
RBC: 4.57 MIL/uL (ref 3.87–5.11)
RDW: 15.7 % — ABNORMAL HIGH (ref 11.5–15.5)
WBC: 8.8 10*3/uL (ref 4.0–10.5)

## 2014-01-04 LAB — URINALYSIS, ROUTINE W REFLEX MICROSCOPIC
BILIRUBIN URINE: NEGATIVE
GLUCOSE, UA: NEGATIVE mg/dL
HGB URINE DIPSTICK: NEGATIVE
Ketones, ur: NEGATIVE mg/dL
LEUKOCYTES UA: NEGATIVE
Nitrite: NEGATIVE
PH: 6 (ref 5.0–8.0)
PROTEIN: NEGATIVE mg/dL
SPECIFIC GRAVITY, URINE: 1.01 (ref 1.005–1.030)
Urobilinogen, UA: 0.2 mg/dL (ref 0.0–1.0)

## 2014-01-04 LAB — WET PREP, GENITAL
CLUE CELLS WET PREP: NONE SEEN
TRICH WET PREP: NONE SEEN
Yeast Wet Prep HPF POC: NONE SEEN

## 2014-01-04 LAB — HCG, QUANTITATIVE, PREGNANCY: HCG, BETA CHAIN, QUANT, S: 77755 m[IU]/mL — AB (ref <5)

## 2014-01-04 LAB — POCT PREGNANCY, URINE: Preg Test, Ur: POSITIVE — AB

## 2014-01-04 MED ORDER — ACETAMINOPHEN 325 MG PO TABS
650.0000 mg | ORAL_TABLET | Freq: Once | ORAL | Status: AC
  Administered 2014-01-04: 650 mg via ORAL
  Filled 2014-01-04: qty 2

## 2014-01-04 MED ORDER — METOCLOPRAMIDE HCL 10 MG PO TABS
10.0000 mg | ORAL_TABLET | Freq: Once | ORAL | Status: AC
  Administered 2014-01-04: 10 mg via ORAL
  Filled 2014-01-04: qty 1

## 2014-01-04 MED ORDER — METOCLOPRAMIDE HCL 10 MG PO TABS: 10.0000 mg | ORAL_TABLET | Freq: Four times a day (QID) | ORAL | Status: DC

## 2014-01-04 NOTE — Discharge Instructions (Signed)
Abdominal Pain During Pregnancy °Abdominal pain is common in pregnancy. Most of the time, it does not cause harm. There are many causes of abdominal pain. Some causes are more serious than others. Some of the causes of abdominal pain in pregnancy are easily diagnosed. Occasionally, the diagnosis takes time to understand. Other times, the cause is not determined. Abdominal pain can be a sign that something is very wrong with the pregnancy, or the pain may have nothing to do with the pregnancy at all. For this reason, always tell your health care provider if you have any abdominal discomfort. °HOME CARE INSTRUCTIONS  °Monitor your abdominal pain for any changes. The following actions may help to alleviate any discomfort you are experiencing: °· Do not have sexual intercourse or put anything in your vagina until your symptoms go away completely. °· Get plenty of rest until your pain improves. °· Drink clear fluids if you feel nauseous. Avoid solid food as long as you are uncomfortable or nauseous. °· Only take over-the-counter or prescription medicine as directed by your health care provider. °· Keep all follow-up appointments with your health care provider. °SEEK IMMEDIATE MEDICAL CARE IF: °· You are bleeding, leaking fluid, or passing tissue from the vagina. °· You have increasing pain or cramping. °· You have persistent vomiting. °· You have painful or bloody urination. °· You have a fever. °· You notice a decrease in your baby's movements. °· You have extreme weakness or feel faint. °· You have shortness of breath, with or without abdominal pain. °· You develop a severe headache with abdominal pain. °· You have abnormal vaginal discharge with abdominal pain. °· You have persistent diarrhea. °· You have abdominal pain that continues even after rest, or gets worse. °MAKE SURE YOU:  °· Understand these instructions. °· Will watch your condition. °· Will get help right away if you are not doing well or get  worse. °Document Released: 12/20/2004 Document Revised: 10/10/2012 Document Reviewed: 07/19/2012 °ExitCare® Patient Information ©2015 [employer]. This information is not intended to replace advice given to you by your health care provider. Make sure you discuss any questions you have with your health care provider. °First Trimester of Pregnancy °The first trimester of pregnancy is from week 1 until the end of week 12 (months 1 through 3). A week after a sperm fertilizes an egg, the egg will implant on the wall of the uterus. This embryo will begin to develop into a baby. Genes from you and your partner are forming the baby. The female genes determine whether the baby is a boy or a girl. At 6-8 weeks, the eyes and face are formed, and the heartbeat can be seen on ultrasound. At the end of 12 weeks, all the baby's organs are formed.  °Now that you are pregnant, you will want to do everything you can to have a healthy baby. Two of the most important things are to get good prenatal care and to follow your health care provider's instructions. Prenatal care is all the medical care you receive before the baby's birth. This care will help prevent, find, and treat any problems during the pregnancy and childbirth. °BODY CHANGES °Your body goes through many changes during pregnancy. The changes vary from woman to woman.  °· You may gain or lose a couple of pounds at first. °· You may feel sick to your stomach (nauseous) and throw up (vomit). If the vomiting is uncontrollable, call your health care provider. °· You may tire easily. °·   You may develop headaches that can be relieved by medicines approved by your health care provider. °· You may urinate more often. Painful urination may mean you have a bladder infection. °· You may develop heartburn as a result of your pregnancy. °· You may develop constipation because certain hormones are causing the muscles that push waste through your intestines to slow down. °· You may  develop hemorrhoids or swollen, bulging veins (varicose veins). °· Your breasts may begin to grow larger and become tender. Your nipples may stick out more, and the tissue that surrounds them (areola) may become darker. °· Your gums may bleed and may be sensitive to brushing and flossing. °· Dark spots or blotches (chloasma, mask of pregnancy) may develop on your face. This will likely fade after the baby is born. °· Your menstrual periods will stop. °· You may have a loss of appetite. °· You may develop cravings for certain kinds of food. °· You may have changes in your emotions from day to day, such as being excited to be pregnant or being concerned that something may go wrong with the pregnancy and baby. °· You may have more vivid and strange dreams. °· You may have changes in your hair. These can include thickening of your hair, rapid growth, and changes in texture. Some women also have hair loss during or after pregnancy, or hair that feels dry or thin. Your hair will most likely return to normal after your baby is born. °WHAT TO EXPECT AT YOUR PRENATAL VISITS °During a routine prenatal visit: °· You will be weighed to make sure you and the baby are growing normally. °· Your blood pressure will be taken. °· Your abdomen will be measured to track your baby's growth. °· The fetal heartbeat will be listened to starting around week 10 or 12 of your pregnancy. °· Test results from any previous visits will be discussed. °Your health care provider may ask you: °· How you are feeling. °· If you are feeling the baby move. °· If you have had any abnormal symptoms, such as leaking fluid, bleeding, severe headaches, or abdominal cramping. °· If you have any questions. °Other tests that may be performed during your first trimester include: °· Blood tests to find your blood type and to check for the presence of any previous infections. They will also be used to check for low iron levels (anemia) and Rh antibodies. Later in  the pregnancy, blood tests for diabetes will be done along with other tests if problems develop. °· Urine tests to check for infections, diabetes, or protein in the urine. °· An ultrasound to confirm the proper growth and development of the baby. °· An amniocentesis to check for possible genetic problems. °· Fetal screens for spina bifida and Down syndrome. °· You may need other tests to make sure you and the baby are doing well. °HOME CARE INSTRUCTIONS  °Medicines °· Follow your health care provider's instructions regarding medicine use. Specific medicines may be either safe or unsafe to take during pregnancy. °· Take your prenatal vitamins as directed. °· If you develop constipation, try taking a stool softener if your health care provider approves. °Diet °· Eat regular, well-balanced meals. Choose a variety of foods, such as meat or vegetable-based protein, fish, milk and low-fat dairy products, vegetables, fruits, and whole grain breads and cereals. Your health care provider will help you determine the amount of weight gain that is right for you. °· Avoid raw meat and uncooked cheese. These   carry germs that can cause birth defects in the baby. °· Eating four or five small meals rather than three large meals a day may help relieve nausea and vomiting. If you start to feel nauseous, eating a few soda crackers can be helpful. Drinking liquids between meals instead of during meals also seems to help nausea and vomiting. °· If you develop constipation, eat more high-fiber foods, such as fresh vegetables or fruit and whole grains. Drink enough fluids to keep your urine clear or pale yellow. °Activity and Exercise °· Exercise only as directed by your health care provider. Exercising will help you: °¨ Control your weight. °¨ Stay in shape. °¨ Be prepared for labor and delivery. °· Experiencing pain or cramping in the lower abdomen or low back is a good sign that you should stop exercising. Check with your health care  provider before continuing normal exercises. °· Try to avoid standing for long periods of time. Move your legs often if you must stand in one place for a long time. °· Avoid heavy lifting. °· Wear low-heeled shoes, and practice good posture. °· You may continue to have sex unless your health care provider directs you otherwise. °Relief of Pain or Discomfort °· Wear a good support bra for breast tenderness.   °· Take warm sitz baths to soothe any pain or discomfort caused by hemorrhoids. Use hemorrhoid cream if your health care provider approves.   °· Rest with your legs elevated if you have leg cramps or low back pain. °· If you develop varicose veins in your legs, wear support hose. Elevate your feet for 15 minutes, 3-4 times a day. Limit salt in your diet. °Prenatal Care °· Schedule your prenatal visits by the twelfth week of pregnancy. They are usually scheduled monthly at first, then more often in the last 2 months before delivery. °· Write down your questions. Take them to your prenatal visits. °· Keep all your prenatal visits as directed by your health care provider. °Safety °· Wear your seat belt at all times when driving. °· Make a list of emergency phone numbers, including numbers for family, friends, the hospital, and police and fire departments. °General Tips °· Ask your health care provider for a referral to a local prenatal education class. Begin classes no later than at the beginning of month 6 of your pregnancy. °· Ask for help if you have counseling or nutritional needs during pregnancy. Your health care provider can offer advice or refer you to specialists for help with various needs. °· Do not use hot tubs, steam rooms, or saunas. °· Do not douche or use tampons or scented sanitary pads. °· Do not cross your legs for long periods of time. °· Avoid cat litter boxes and soil used by cats. These carry germs that can cause birth defects in the baby and possibly loss of the fetus by miscarriage or  stillbirth. °· Avoid all smoking, herbs, alcohol, and medicines not prescribed by your health care provider. Chemicals in these affect the formation and growth of the baby. °· Schedule a dentist appointment. At home, brush your teeth with a soft toothbrush and be gentle when you floss. °SEEK MEDICAL CARE IF:  °· You have dizziness. °· You have mild pelvic cramps, pelvic pressure, or nagging pain in the abdominal area. °· You have persistent nausea, vomiting, or diarrhea. °· You have a bad smelling vaginal discharge. °· You have pain with urination. °· You notice increased swelling in your face, hands, legs, or ankles. °SEEK   IMMEDIATE MEDICAL CARE IF:  °· You have a fever. °· You are leaking fluid from your vagina. °· You have spotting or bleeding from your vagina. °· You have severe abdominal cramping or pain. °· You have rapid weight gain or loss. °· You vomit blood or material that looks like coffee grounds. °· You are exposed to German measles and have never had them. °· You are exposed to fifth disease or chickenpox. °· You develop a severe headache. °· You have shortness of breath. °· You have any kind of trauma, such as from a fall or a car accident. °Document Released: 12/14/2000 Document Revised: 05/06/2013 Document Reviewed: 10/30/2012 °ExitCare® Patient Information ©2015 [employer]. This information is not intended to replace advice given to you by your health care provider. Make sure you discuss any questions you have with your health care provider. ° °

## 2014-01-04 NOTE — [facility] Provider Note (Signed)
History     CSN: [id number]  Arrival date and time: 01/04/14 1000   First Provider Initiated Contact with Patient 01/04/14 1137      Chief Complaint  Patient presents with  . Abdominal Pain  . Morning Sickness   HPI Comments: Sue Clayton 29 y.o. [id number] [redacted]w[redacted]d presents to MAU with pelvic pain that has been ongoing x 2 weeks. She has taken no medications. She denies vaginal bleeding. She also has nausea and vomiting. She has not established prenatal care.  Abdominal Pain Associated symptoms include nausea and vomiting.      Past Medical History  Diagnosis Date  . Anxiety   . Mental disorder   . Depression     Past Surgical History  Procedure Laterality Date  . Cesarean section    . Lithotripsy      History reviewed. No pertinent family history.  History  Substance Use Topics  . Smoking status: Current Every Day Smoker -- 1.00 packs/day    Types: Cigarettes  . Smokeless tobacco: Not on file  . Alcohol Use: No    Allergies: No Known Allergies  Prescriptions prior to admission  Medication Sig Dispense Refill Last Dose  . metroNIDAZOLE (FLAGYL) 500 MG tablet Take 1 tablet (500 mg total) by mouth 2 (two) times daily. (Patient not taking: Reported on 01/04/2014) 14 tablet 0     Review of Systems  Constitutional: Negative.   HENT: Negative.   Eyes: Negative.   Respiratory: Negative.   Cardiovascular: Negative.   Gastrointestinal: Positive for nausea, vomiting and abdominal pain.  Genitourinary: Negative.   Musculoskeletal: Negative.   Skin: Negative.   Neurological: Negative.   Psychiatric/Behavioral: Negative.    Physical Exam   Blood pressure 125/75, pulse 76, temperature 98.1 F (36.7 C), resp. rate 18, last menstrual period 11/08/2013.  Physical Exam  Constitutional: She is oriented to person, place, and time. She appears well-developed and well-nourished. No distress.  HENT:  Head: Normocephalic and atraumatic.  GI: Soft. She exhibits no  distension. There is tenderness. There is no rebound and no guarding.  Genitourinary:  Genital:external negative Vaginal:small amount white discharge Cervix:closed/ thick Bimanual: tenderness over entire pelvic region   Neurological: She is alert and oriented to person, place, and time.  Skin: Skin is warm and dry.  Psychiatric: She has a normal mood and affect. Her behavior is normal. Judgment and thought content normal.   Results for orders placed or performed during the hospital encounter of 01/04/14 (from the past 24 hour(s))  Urinalysis, Routine w reflex microscopic     Status: None   Collection Time: 01/04/14 10:14 AM  Result Value Ref Range   Color, Urine YELLOW YELLOW   APPearance CLEAR CLEAR   Specific Gravity, Urine 1.010 1.005 - 1.030   pH 6.0 5.0 - 8.0   Glucose, UA NEGATIVE NEGATIVE mg/dL   Hgb urine dipstick NEGATIVE NEGATIVE   Bilirubin Urine NEGATIVE NEGATIVE   Ketones, ur NEGATIVE NEGATIVE mg/dL   Protein, ur NEGATIVE NEGATIVE mg/dL   Urobilinogen, UA 0.2 0.0 - 1.0 mg/dL   Nitrite NEGATIVE NEGATIVE   Leukocytes, UA NEGATIVE NEGATIVE  Pregnancy, urine POC     Status: Abnormal   Collection Time: 01/04/14 10:14 AM  Result Value Ref Range   Preg Test, Ur POSITIVE (A) NEGATIVE  Wet prep, genital     Status: Abnormal   Collection Time: 01/04/14 11:40 AM  Result Value Ref Range   Yeast Wet Prep HPF POC NONE SEEN NONE SEEN  Trich, Wet Prep NONE SEEN NONE SEEN   Clue Cells Wet Prep HPF POC NONE SEEN NONE SEEN   WBC, Wet Prep HPF POC MODERATE (A) NONE SEEN  CBC     Status: Abnormal   Collection Time: 01/04/14 12:10 PM  Result Value Ref Range   WBC 8.8 4.0 - 10.5 K/uL   RBC 4.57 3.87 - 5.11 MIL/uL   Hemoglobin 12.0 12.0 - 15.0 g/dL   HCT 78.2 (L) 95.6 - 21.3 %   MCV 78.3 78.0 - 100.0 fL   MCH 26.3 26.0 - 34.0 pg   MCHC 33.5 30.0 - 36.0 g/dL   RDW 08.6 (H) 57.8 - 46.9 %   Platelets 236 150 - 400 K/uL  hCG, quantitative, pregnancy     Status: Abnormal    Collection Time: 01/04/14 12:10 PM  Result Value Ref Range   hCG, Beta Chain, Quant, S 77755 (H) <5 mIU/mL  ABO/Rh     Status: None (Preliminary result)   Collection Time: 01/04/14 12:10 PM  Result Value Ref Range   ABO/RH(D) O POS    US Ob Comp Less 14 Wks  01/04/2014   CLINICAL DATA:  Left-sided pelvic pain for 2 weeks, first trimester of pregnancy.  EXAM: OBSTETRIC <14 WK [name] AND TRANSVAGINAL OB US  TECHNIQUE: Both transabdominal and transvaginal ultrasound examinations were performed for complete evaluation of the gestation as well as the maternal uterus, adnexal regions, and pelvic cul-de-sac. Transvaginal technique was performed to assess early pregnancy.  COMPARISON:  None.  FINDINGS: Intrauterine gestational sac: Visualized/normal in shape.  Yolk sac:  Visualized.  Embryo:  Visualized.  Cardiac Activity: Visualized.  Heart Rate:  162 bpm  CRL:   13.1  mm   7 w 4 d                  [name] [facility]: August 19, 2014.  Maternal uterus/adnexae: Ovaries appear normal. No free fluid is noted.  IMPRESSION: Single live intrauterine gestation of 7 weeks 4 days.   Electronically Signed   By: Roque Lias M.D.   On: 01/04/2014 13:06   US Ob Transvaginal  01/04/2014   CLINICAL DATA:  Left-sided pelvic pain for 2 weeks, first trimester of pregnancy.  EXAM: OBSTETRIC <14 WK [name] AND TRANSVAGINAL OB US  TECHNIQUE: Both transabdominal and transvaginal ultrasound examinations were performed for complete evaluation of the gestation as well as the maternal uterus, adnexal regions, and pelvic cul-de-sac. Transvaginal technique was performed to assess early pregnancy.  COMPARISON:  None.  FINDINGS: Intrauterine gestational sac: Visualized/normal in shape.  Yolk sac:  Visualized.  Embryo:  Visualized.  Cardiac Activity: Visualized.  Heart Rate:  162 bpm  CRL:   13.1  mm   7 w 4 d                  [name] [facility]: August 19, 2014.  Maternal uterus/adnexae: Ovaries appear normal. No free fluid is noted.  IMPRESSION: Single live intrauterine  gestation of 7 weeks 4 days.   Electronically Signed   By: Roque Lias M.D.   On: 01/04/2014 13:06    MAU Course  Procedures  MDM Wet prep, GC, Chlamydia, CBC, UA, U/S, ABORh, Quant, HIV Tylenol and reglan/ she is driving with another child Reviewed results with patient  Assessment and Plan   A: Abdominal pain in pregnancy Nausea in pregnancy  P: Reviewed all labs and ultrasound  Advised to establish PNC and start PNV Reglan 10 mg po q8 hours prn  nausea Discussed rest/ fluids Return to MAU as needed  Carolynn Serve 01/04/2014, 11:49 AM

## 2014-01-04 NOTE — MAU Note (Signed)
Pt presents to MAU with complaints of lower abdominal pain. Reports nausea and vomiting with dizziness. Denies any vaginal bleeding.

## 2014-01-05 LAB — HIV ANTIBODY (ROUTINE TESTING W REFLEX): HIV: NONREACTIVE

## 2014-01-05 LAB — ABO/RH: ABO/RH(D): O POS

## 2014-01-08 LAB — GC/CHLAMYDIA PROBE AMP
CT Probe RNA: NEGATIVE
GC Probe RNA: NEGATIVE

## 2014-04-15 ENCOUNTER — Inpatient Hospital Stay (HOSPITAL_COMMUNITY): Payer: Medicaid Other

## 2014-04-15 ENCOUNTER — Inpatient Hospital Stay (HOSPITAL_COMMUNITY)
Admission: EM | Admit: 2014-04-15 | Discharge: 2014-04-15 | Disposition: A | Discharge status: Home / Self Care | Payer: Medicaid Other | Source: Ambulatory Visit | Attending: Family Medicine | Admitting: Family Medicine

## 2014-04-15 ENCOUNTER — Encounter (HOSPITAL_COMMUNITY): Payer: Self-pay | Admitting: *Deleted

## 2014-04-15 DIAGNOSIS — N200 Calculus of kidney: Secondary | ICD-10-CM | POA: Diagnosis not present

## 2014-04-15 DIAGNOSIS — K8020 Calculus of gallbladder without cholecystitis without obstruction: Secondary | ICD-10-CM | POA: Diagnosis not present

## 2014-04-15 DIAGNOSIS — F17210 Nicotine dependence, cigarettes, uncomplicated: Secondary | ICD-10-CM | POA: Diagnosis not present

## 2014-04-15 DIAGNOSIS — R102 Pelvic and perineal pain: Secondary | ICD-10-CM | POA: Insufficient documentation

## 2014-04-15 DIAGNOSIS — R109 Unspecified abdominal pain: Secondary | ICD-10-CM

## 2014-04-15 HISTORY — DX: Calculus of kidney: N20.0

## 2014-04-15 HISTORY — DX: Calculus of gallbladder without cholecystitis without obstruction: K80.20

## 2014-04-15 LAB — URINALYSIS, ROUTINE W REFLEX MICROSCOPIC
Bilirubin Urine: NEGATIVE
Glucose, UA: NEGATIVE mg/dL
Hgb urine dipstick: NEGATIVE
Ketones, ur: NEGATIVE mg/dL
Leukocytes, UA: NEGATIVE
Nitrite: NEGATIVE
PH: 6.5 (ref 5.0–8.0)
Protein, ur: NEGATIVE mg/dL
SPECIFIC GRAVITY, URINE: 1.02 (ref 1.005–1.030)
Urobilinogen, UA: 0.2 mg/dL (ref 0.0–1.0)

## 2014-04-15 LAB — WET PREP, GENITAL
Clue Cells Wet Prep HPF POC: NONE SEEN
TRICH WET PREP: NONE SEEN
YEAST WET PREP: NONE SEEN

## 2014-04-15 LAB — CBC
HEMATOCRIT: 34.3 % — AB (ref 36.0–46.0)
HEMOGLOBIN: 11.3 g/dL — AB (ref 12.0–15.0)
MCH: 26.5 pg (ref 26.0–34.0)
MCHC: 32.9 g/dL (ref 30.0–36.0)
MCV: 80.3 fL (ref 78.0–100.0)
Platelets: 207 10*3/uL (ref 150–400)
RBC: 4.27 MIL/uL (ref 3.87–5.11)
RDW: 14.7 % (ref 11.5–15.5)
WBC: 6.5 10*3/uL (ref 4.0–10.5)

## 2014-04-15 LAB — POCT PREGNANCY, URINE: PREG TEST UR: NEGATIVE

## 2014-04-15 MED ORDER — KETOROLAC TROMETHAMINE 60 MG/2ML IM SOLN
60.0000 mg | Freq: Once | INTRAMUSCULAR | Status: AC
  Administered 2014-04-15: 60 mg via INTRAMUSCULAR
  Filled 2014-04-15: qty 2

## 2014-04-15 MED ORDER — IOHEXOL 300 MG/ML  SOLN
100.0000 mL | Freq: Once | INTRAMUSCULAR | Status: AC | PRN
  Administered 2014-04-15: 100 mL via INTRAVENOUS

## 2014-04-15 MED ORDER — OXYCODONE-ACETAMINOPHEN 5-325 MG PO TABS
1.0000 | ORAL_TABLET | Freq: Once | ORAL | Status: AC
  Administered 2014-04-15: 1 via ORAL
  Filled 2014-04-15: qty 1

## 2014-04-15 MED ORDER — LACTATED RINGERS IV BOLUS (SEPSIS)
1000.0000 mL | Freq: Once | INTRAVENOUS | Status: AC
  Administered 2014-04-15: 1000 mL via INTRAVENOUS

## 2014-04-15 MED ORDER — OXYCODONE-ACETAMINOPHEN 5-325 MG PO TABS: 1.0000 | ORAL_TABLET | ORAL | Status: DC | PRN

## 2014-04-15 MED ORDER — ONDANSETRON HCL 8 MG PO TABS: 8.0000 mg | ORAL_TABLET | Freq: Three times a day (TID) | ORAL | Status: DC | PRN

## 2014-04-15 MED ORDER — IOHEXOL 300 MG/ML  SOLN
50.0000 mL | Freq: Once | INTRAMUSCULAR | Status: AC | PRN
  Administered 2014-04-15: 50 mL via ORAL

## 2014-04-15 NOTE — MAU Provider Note (Signed)
History     CSN: [id number]  Arrival date and time: 04/15/14 4098   None     Chief Complaint  Patient presents with  . Pelvic Pain   HPI   Patient reports that abdominal pain has been increasing over the last week.  She points to her lower abdomen and states that it wraps around to her back.  Pain is sharp and numbing like pins and needles.  Pain is worse on L side.  Position aggravates and relieves pain.  Sitting a long time makes pain worse.  Has taken Motrin x1 week with no relief.  S/p elective abortion 02/10/2014. Patient endorses white vaginal discharge.  No odor, no dysuria, fevers, chills.  Endorses scant hematuria, frequency and hesitancy.  Endorses pelvic pain with intercourse.  Uses condoms intermittently.    OB History    Gravida Para Term Preterm AB TAB SAB Ectopic Multiple Living   Past Medical History  Diagnosis Date  . Anxiety   . Mental disorder   . Depression     Past Surgical History  Procedure Laterality Date  . Cesarean section    . Lithotripsy      History reviewed. No pertinent family history.  History  Substance Use Topics  . Smoking status: Current Every Day Smoker -- 1.00 packs/day    Types: Cigarettes  . Smokeless tobacco: Not on file  . Alcohol Use: No    Allergies: No Known Allergies  Prescriptions prior to admission  Medication Sig Dispense Refill Last Dose  . ibuprofen (ADVIL,MOTRIN) 400 MG tablet Take 400 mg by mouth every 6 (six) hours as needed.   04/14/2014 at Unknown time  . metoCLOPramide (REGLAN) 10 MG tablet Take 1 tablet (10 mg total) by mouth every 6 (six) hours. (Patient not taking: Reported on 04/15/2014) 30 tablet 0     ROS Physical Exam   Blood pressure 124/84, pulse 67, temperature 98 F (36.7 C), temperature source Oral, resp. rate 18, last menstrual period 03/20/2014, unknown if currently breastfeeding.  Physical Exam  Constitutional: She is oriented to person, place, and time. She appears  well-developed and well-nourished. No distress.  HENT:  Head: Normocephalic and atraumatic.  Eyes: EOM are normal. Pupils are equal, round, and reactive to light. No scleral icterus.  Neck: Normal range of motion. Neck supple.  Cardiovascular: Normal rate, regular rhythm, normal heart sounds and intact distal pulses.  Exam reveals no gallop and no friction rub.   No murmur heard. Respiratory: Effort normal and breath sounds normal. She has no wheezes.  GI: Soft. Bowel sounds are normal. She exhibits no distension and no mass. There is tenderness (mild generalized lower abominal TTP). There is no rebound and no guarding.  Genitourinary:  No suprapubic TTP  Musculoskeletal: Normal range of motion. She exhibits no edema or tenderness.  No CVA TTP  Neurological: She is alert and oriented to person, place, and time.  Skin: Skin is warm and dry. No rash noted.  Psychiatric: She has a normal mood and affect. Her behavior is normal.   Results for orders placed or performed during the hospital encounter of 04/15/14 (from the past 24 hour(s))  Urinalysis, Routine w reflex microscopic     Status: None   Collection Time: 04/15/14  9:35 AM  Result Value Ref Range   Color, Urine YELLOW YELLOW   APPearance CLEAR CLEAR   Specific Gravity, Urine 1.020 1.005 - 1.030  pH 6.5 5.0 - 8.0   Glucose, UA NEGATIVE NEGATIVE mg/dL   Hgb urine dipstick NEGATIVE NEGATIVE   Bilirubin Urine NEGATIVE NEGATIVE   Ketones, ur NEGATIVE NEGATIVE mg/dL   Protein, ur NEGATIVE NEGATIVE mg/dL   Urobilinogen, UA 0.2 0.0 - 1.0 mg/dL   Nitrite NEGATIVE NEGATIVE   Leukocytes, UA NEGATIVE NEGATIVE  Pregnancy, urine POC     Status: None   Collection Time: 04/15/14  9:37 AM  Result Value Ref Range   Preg Test, Ur NEGATIVE NEGATIVE  Wet prep, genital     Status: Abnormal   Collection Time: 04/15/14 10:27 AM  Result Value Ref Range   Yeast Wet Prep HPF POC NONE SEEN NONE SEEN   Trich, Wet Prep NONE SEEN NONE SEEN   Clue  Cells Wet Prep HPF POC NONE SEEN NONE SEEN   WBC, Wet Prep HPF POC FEW (A) NONE SEEN  CBC     Status: Abnormal   Collection Time: 04/15/14 10:34 AM  Result Value Ref Range   WBC 6.5 4.0 - 10.5 K/uL   RBC 4.27 3.87 - 5.11 MIL/uL   Hemoglobin 11.3 (L) 12.0 - 15.0 g/dL   HCT 16.134.3 (L) 09.636.0 - 04.546.0 %   MCV 80.3 78.0 - 100.0 fL   MCH 26.5 26.0 - 34.0 pg   MCHC 32.9 30.0 - 36.0 g/dL   RDW 40.914.7 81.111.5 - 91.415.5 %   Platelets 207 150 - 400 K/uL   Results for orders placed or performed during the hospital encounter of 04/15/14 (from the past 24 hour(s))  Urinalysis, Routine w reflex microscopic     Status: None   Collection Time: 04/15/14  9:35 AM  Result Value Ref Range   Color, Urine YELLOW YELLOW   APPearance CLEAR CLEAR   Specific Gravity, Urine 1.020 1.005 - 1.030   pH 6.5 5.0 - 8.0   Glucose, UA NEGATIVE NEGATIVE mg/dL   Hgb urine dipstick NEGATIVE NEGATIVE   Bilirubin Urine NEGATIVE NEGATIVE   Ketones, ur NEGATIVE NEGATIVE mg/dL   Protein, ur NEGATIVE NEGATIVE mg/dL   Urobilinogen, UA 0.2 0.0 - 1.0 mg/dL   Nitrite NEGATIVE NEGATIVE   Leukocytes, UA NEGATIVE NEGATIVE  Pregnancy, urine POC     Status: None   Collection Time: 04/15/14  9:37 AM  Result Value Ref Range   Preg Test, Ur NEGATIVE NEGATIVE  Wet prep, genital     Status: Abnormal   Collection Time: 04/15/14 10:27 AM  Result Value Ref Range   Yeast Wet Prep HPF POC NONE SEEN NONE SEEN   Trich, Wet Prep NONE SEEN NONE SEEN   Clue Cells Wet Prep HPF POC NONE SEEN NONE SEEN   WBC, Wet Prep HPF POC FEW (A) NONE SEEN  CBC     Status: Abnormal   Collection Time: 04/15/14 10:34 AM  Result Value Ref Range   WBC 6.5 4.0 - 10.5 K/uL   RBC 4.27 3.87 - 5.11 MIL/uL   Hemoglobin 11.3 (L) 12.0 - 15.0 g/dL   HCT 78.234.3 (L) 95.636.0 - 21.346.0 %   MCV 80.3 78.0 - 100.0 fL   MCH 26.5 26.0 - 34.0 pg   MCHC 32.9 30.0 - 36.0 g/dL   RDW 08.614.7 57.811.5 - 46.915.5 %   Platelets 207 150 - 400 K/uL   Ct Abdomen W Contrast  04/15/2014   CLINICAL DATA:  Lower  abdominal pain. Left flank pain for 1 week. Nausea. History of gallstones.  EXAM: CT ABDOMEN WITH CONTRAST  TECHNIQUE: Multidetector CT  imaging of the abdomen was performed using the standard protocol following bolus administration of intravenous contrast.  CONTRAST:  OMNIPAQUE IOHEXOL 300 MG/ML  SOLN  COMPARISON:  Multiple exams, including 04/15/2014 and 04/16/2013  FINDINGS: Lower chest:  Unremarkable  Hepatobiliary: Hepatic imaging is before hepatic venous opacification. Taking this into account, hepatic parenchymal enhancement is within normal limits. Small dependent gallstones in the gallbladder. No biliary dilatation. No gallbladder wall thickening.  Pancreas: Unremarkable  Spleen: Unremarkable  Adrenals/Urinary Tract: Nonobstructive 3 mm left mid kidney calculus, image 28 series 2.  Stomach/Bowel: Unremarkable  Vascular/Lymphatic: Mildly dilated right gonadal vein, thought to be incidental.  Other: No supplemental non-categorized findings.  Musculoskeletal: Unremarkable  IMPRESSION: 1. Small dependent gallstones in the gallbladder, without gallbladder wall thickening or pericholecystic fluid identified. No biliary dilatation. 2. Nonobstructive left nephrolithiasis.   Electronically Signed   By: Gaylyn Rong M.D.   On: 04/15/2014 15:47   US Transvaginal Non-ob  04/15/2014   CLINICAL DATA:  Pelvic pain in female.  EXAM: TRANSABDOMINAL AND TRANSVAGINAL ULTRASOUND OF PELVIS  TECHNIQUE: Both transabdominal and transvaginal ultrasound examinations of the pelvis were performed. Transabdominal technique was performed for global imaging of the pelvis including uterus, ovaries, adnexal regions, and pelvic cul-de-sac. It was necessary to proceed with endovaginal exam following the transabdominal exam to visualize the endometrium and ovaries.  COMPARISON:  April 19, 2013.  FINDINGS: Uterus  Measurements: 10.5 x 5.7 x 4.9 cm. No fibroids or other mass visualized.  Endometrium  Thickness: 11.8 mm. 11 x 10 x  8 mm hypoechoic abnormality is noted in the endometrial space.  Right ovary  Measurements: 3.8 x 2.0 x 1.9 cm. Normal appearance/no adnexal mass.  Left ovary  Measurements: 2.6 x 2.2 x 2.1 cm. Normal appearance/no adnexal mass.  Other findings  Mild amount of free fluid is noted.  IMPRESSION: A focal endometrial lesion is suspected. Consider sonohysterogram for further evaluation, prior to hysteroscopy or endometrial biopsy. Ovaries appear normal.   Electronically Signed   By: Lupita Raider, M.D.   On: 04/15/2014 12:58   US Pelvis Complete  04/15/2014   CLINICAL DATA:  Pelvic pain in female.  EXAM: TRANSABDOMINAL AND TRANSVAGINAL ULTRASOUND OF PELVIS  TECHNIQUE: Both transabdominal and transvaginal ultrasound examinations of the pelvis were performed. Transabdominal technique was performed for global imaging of the pelvis including uterus, ovaries, adnexal regions, and pelvic cul-de-sac. It was necessary to proceed with endovaginal exam following the transabdominal exam to visualize the endometrium and ovaries.  COMPARISON:  April 19, 2013.  FINDINGS: Uterus  Measurements: 10.5 x 5.7 x 4.9 cm. No fibroids or other mass visualized.  Endometrium  Thickness: 11.8 mm. 11 x 10 x 8 mm hypoechoic abnormality is noted in the endometrial space.  Right ovary  Measurements: 3.8 x 2.0 x 1.9 cm. Normal appearance/no adnexal mass.  Left ovary  Measurements: 2.6 x 2.2 x 2.1 cm. Normal appearance/no adnexal mass.  Other findings  Mild amount of free fluid is noted.  IMPRESSION: A focal endometrial lesion is suspected. Consider sonohysterogram for further evaluation, prior to hysteroscopy or endometrial biopsy. Ovaries appear normal.   Electronically Signed   By: Lupita Raider, M.D.   On: 04/15/2014 12:58    MAU Course  Procedures  MDM  Wet prep unremarkable Urinalysis negative CBC unremarkable UPreg negative U/S pelvic Ct- Abdomen IV Hydration Toradol IM  Assessment and Plan  Kidney Stones Gall  Stones  Percocet 5/325mg  PO #30 Strain urine Follow up with PCP Discharge to  home     Delynn Flavin M, DO 04/15/2014, 10:45 AM    I was present for Resident's exam and agree with above.  Rhea Pink, CNM 04/15/2014 4:26 PM

## 2014-04-15 NOTE — Discharge Instructions (Signed)
Low-Fat Diet for Pancreatitis or Gallbladder Conditions A low-fat diet can be helpful if you have pancreatitis or a gallbladder condition. With these conditions, your pancreas and gallbladder have trouble digesting fats. A healthy eating plan with less fat will help rest your pancreas and gallbladder and reduce your symptoms. WHAT DO I NEED TO KNOW ABOUT THIS DIET?  Eat a low-fat diet.  Reduce your fat intake to less than 20-30% of your total daily calories. This is less than 50-60 g of fat per day.  Remember that you need some fat in your diet. Ask your dietician what your daily goal should be.  Choose nonfat and low-fat healthy foods. Look for the words "nonfat," "low fat," or "fat free."  As a guide, look on the label and choose foods with less than 3 g of fat per serving. Eat only one serving.  Avoid alcohol.  Do not smoke. If you need help quitting, talk with your health care provider.  Eat small frequent meals instead of three large heavy meals. WHAT FOODS CAN I EAT? Grains Include healthy grains and starches such as potatoes, wheat bread, fiber-rich cereal, and brown rice. Choose whole grain options whenever possible. In adults, whole grains should account for 45-65% of your daily calories.  Fruits and Vegetables Eat plenty of fruits and vegetables. Fresh fruits and vegetables add fiber to your diet. Meats and Other Protein Sources Eat lean meat such as chicken and pork. Trim any fat off of meat before cooking it. Eggs, fish, and beans are other sources of protein. In adults, these foods should account for 10-35% of your daily calories. Dairy Choose low-fat milk and dairy options. Dairy includes fat and protein, as well as calcium.  Fats and Oils Limit high-fat foods such as fried foods, sweets, baked goods, sugary drinks.  Other Creamy sauces and condiments, such as mayonnaise, can add extra fat. Think about whether or not you need to use them, or use smaller amounts or low fat  options. WHAT FOODS ARE NOT RECOMMENDED?  High fat foods, such as:  [employer].  Ice cream.  [place] toast.  Sweet rolls.  Pizza.  Cheese bread.  Foods covered with batter, butter, creamy sauces, or cheese.  Fried foods.  Sugary drinks and desserts.  Foods that cause gas or bloating Document Released: 12/25/2012 Document Reviewed: 12/25/2012 [facility] Patient Information 2015 [place], [place]. This information is not intended to replace advice given to you by your health care provider. Make sure you discuss any questions you have with your health care provider. Kidney Stones Kidney stones (urolithiasis) are solid masses that form inside your kidneys. The intense pain is caused by the stone moving through the kidney, ureter, bladder, and urethra (urinary tract). When the stone moves, the ureter starts to spasm around the stone. The stone is usually passed in your pee (urine).  HOME CARE  Drink enough fluids to keep your pee clear or pale yellow. This helps to get the stone out.  Strain all pee through the provided strainer. Do not pee without peeing through the strainer, not even once. If you pee the stone out, catch it in the strainer. The stone may be as small as a grain of salt. Take this to your doctor. This will help your doctor figure out what you can do to try to prevent more kidney stones.  Only take medicine as told by your doctor.  Follow up with your doctor as told.  Get follow-up X-rays as told by your doctor.  GET HELP IF: You have pain that gets worse even if you have been taking pain medicine. GET HELP RIGHT AWAY IF:   Your pain does not get better with medicine.  You have a fever or shaking chills.  Your pain increases and gets worse over 18 hours.  You have new belly (abdominal) pain.  You feel faint or pass out.  You are unable to pee. MAKE SURE YOU:   Understand these instructions.  Will watch your condition.  Will get help right away if you  are not doing well or get worse. Document Released: 06/08/2007 Document Revised: 08/22/2012 Document Reviewed: 05/23/2012 [facility] Patient Information 2015 [place]. This information is not intended to replace advice given to you by your health care provider. Make sure you discuss any questions you have with your health care provider.

## 2014-04-15 NOTE — MAU Note (Signed)
Pt states she had abortion on february 8th

## 2014-04-15 NOTE — MAU Note (Signed)
Stated about a wk ago.  Started in lower abd/pelvis now around to back.  Is just not normal.  Constant pain. Denies urinary problems.  Has been having more frequent BM's since pain started, not watery- but softer than usual.

## 2014-04-16 LAB — HIV ANTIBODY (ROUTINE TESTING W REFLEX): HIV Screen 4th Generation wRfx: NONREACTIVE

## 2014-04-16 LAB — GC/CHLAMYDIA PROBE AMP (~~LOC~~) NOT AT ARMC
Chlamydia: NEGATIVE
Neisseria Gonorrhea: NEGATIVE

## 2014-05-15 ENCOUNTER — Inpatient Hospital Stay (HOSPITAL_COMMUNITY)
Admission: AD | Admit: 2014-05-15 | Discharge: 2014-05-15 | Disposition: A | Discharge status: Home / Self Care | Payer: Medicaid Other | Source: Ambulatory Visit | Attending: Obstetrics & Gynecology | Admitting: Obstetrics & Gynecology

## 2014-05-15 DIAGNOSIS — Z87442 Personal history of urinary calculi: Secondary | ICD-10-CM | POA: Insufficient documentation

## 2014-05-15 DIAGNOSIS — F17210 Nicotine dependence, cigarettes, uncomplicated: Secondary | ICD-10-CM | POA: Insufficient documentation

## 2014-05-15 DIAGNOSIS — B373 Candidiasis of vulva and vagina: Secondary | ICD-10-CM | POA: Diagnosis not present

## 2014-05-15 DIAGNOSIS — B3731 Acute candidiasis of vulva and vagina: Secondary | ICD-10-CM

## 2014-05-15 DIAGNOSIS — N898 Other specified noninflammatory disorders of vagina: Secondary | ICD-10-CM | POA: Diagnosis present

## 2014-05-15 LAB — WET PREP, GENITAL
Clue Cells Wet Prep HPF POC: NONE SEEN
Trich, Wet Prep: NONE SEEN
Yeast Wet Prep HPF POC: NONE SEEN

## 2014-05-15 LAB — URINALYSIS, ROUTINE W REFLEX MICROSCOPIC
BILIRUBIN URINE: NEGATIVE
Glucose, UA: NEGATIVE mg/dL
HGB URINE DIPSTICK: NEGATIVE
KETONES UR: NEGATIVE mg/dL
Leukocytes, UA: NEGATIVE
NITRITE: NEGATIVE
Protein, ur: NEGATIVE mg/dL
Specific Gravity, Urine: 1.02 (ref 1.005–1.030)
UROBILINOGEN UA: 0.2 mg/dL (ref 0.0–1.0)
pH: 7.5 (ref 5.0–8.0)

## 2014-05-15 LAB — POCT PREGNANCY, URINE: Preg Test, Ur: NEGATIVE

## 2014-05-15 MED ORDER — FLUCONAZOLE 150 MG PO TABS
150.0000 mg | ORAL_TABLET | Freq: Once | ORAL | Status: AC
  Administered 2014-05-15: 150 mg via ORAL
  Filled 2014-05-15: qty 1

## 2014-05-15 NOTE — Discharge Instructions (Signed)

## 2014-05-15 NOTE — MAU Note (Signed)
Pt presents to MAU with complaints of a vaginal discharge with itching and burning. Denies any vaginal bleeding.

## 2014-05-15 NOTE — MAU Provider Note (Signed)
History     CSN: [id number]  Arrival date and time: 05/15/14 [id number]   First Provider Initiated Contact with Patient 05/15/14 0940      Chief Complaint  Patient presents with  . Vaginal Discharge   HPI  Sue Clayton is a 29 y.o. [phone] who presents to MAU today with complaint of a clumpy white discharge that resembles cottage cheese x 2-3 days. She also states associated irritation of the vaginal area with burning. She denies vaginal bleeding, fever, abdominal pain. She is sexually active and uses condoms.   OB History    Gravida Para Term Preterm AB TAB SAB Ectopic Multiple Living   8 4 4  3 3    4       Past Medical History  Diagnosis Date  . Anxiety   . Mental disorder   . Depression   . Kidney stones   . Gall stones     Past Surgical History  Procedure Laterality Date  . Cesarean section    . Lithotripsy      No family history on file.  History  Substance Use Topics  . Smoking status: Current Every Day Smoker -- 1.00 packs/day    Types: Cigarettes  . Smokeless tobacco: Not on file  . Alcohol Use: No    Allergies: No Known Allergies  No prescriptions prior to admission    Review of Systems  Constitutional: Negative for fever and malaise/fatigue.  Gastrointestinal: Negative for nausea, vomiting, abdominal pain, diarrhea and constipation.  Genitourinary: Negative for dysuria, urgency and frequency.       + vaginal discharge Neg - vaginal bleeding   Physical Exam   Blood pressure 130/81, pulse 57, temperature 98.2 F (36.8 C), resp. rate 18, last menstrual period 04/23/2014, unknown if currently breastfeeding.  Physical Exam  Nursing note and vitals reviewed. Constitutional: She is oriented to person, place, and time. She appears well-developed and well-nourished. No distress.  HENT:  Head: Normocephalic and atraumatic.  Cardiovascular: Normal rate.   Respiratory: Effort normal.  GI: Soft. She exhibits no distension and no mass. There  is no tenderness. There is no rebound and no guarding.  Genitourinary: Uterus is not enlarged and not tender. Cervix exhibits no motion tenderness, no discharge and no friability. Right adnexum displays no mass and no tenderness. Left adnexum displays no mass and no tenderness. No bleeding in the vagina. Vaginal discharge (moderate amount of thick, white discharge noted) found.  Neurological: She is alert and oriented to person, place, and time.  Skin: Skin is warm and dry. No erythema.  Psychiatric: She has a normal mood and affect.   Results for orders placed or performed during the hospital encounter of 05/15/14 (from the past 24 hour(s))  Urinalysis, Routine w reflex microscopic     Status: None   Collection Time: 05/15/14  9:20 AM  Result Value Ref Range   Color, Urine YELLOW YELLOW   APPearance CLEAR CLEAR   Specific Gravity, Urine 1.020 1.005 - 1.030   pH 7.5 5.0 - 8.0   Glucose, UA NEGATIVE NEGATIVE mg/dL   Hgb urine dipstick NEGATIVE NEGATIVE   Bilirubin Urine NEGATIVE NEGATIVE   Ketones, ur NEGATIVE NEGATIVE mg/dL   Protein, ur NEGATIVE NEGATIVE mg/dL   Urobilinogen, UA 0.2 0.0 - 1.0 mg/dL   Nitrite NEGATIVE NEGATIVE   Leukocytes, UA NEGATIVE NEGATIVE  Pregnancy, urine POC     Status: None   Collection Time: 05/15/14  9:20 AM  Result Value Ref Range  Preg Test, Ur NEGATIVE NEGATIVE  Wet prep, genital     Status: Abnormal   Collection Time: 05/15/14  9:45 AM  Result Value Ref Range   Yeast Wet Prep HPF POC NONE SEEN NONE SEEN   Trich, Wet Prep NONE SEEN NONE SEEN   Clue Cells Wet Prep HPF POC NONE SEEN NONE SEEN   WBC, Wet Prep HPF POC FEW (A) NONE SEEN    MAU Course  Procedures None  MDM UPT - negative UA, wet prep, GC/Chlamydia Declines HIV testing Wet prep negative for yeast, will treat with Diflucan today in MAU based on clinical suspicion Assessment and Plan  A: Yeast vulvovaginitis, clinical  P: Discharge home Treated in MAU with Diflucan Warning  signs for worsening condition discussed Patient may return to MAU as needed or if her condition were to change or worsen   Marny LowensteinJulie N Wenzel, PA-C  05/15/2014, 12:11 PM

## 2014-05-16 LAB — GC/CHLAMYDIA PROBE AMP (~~LOC~~) NOT AT ARMC
Chlamydia: NEGATIVE
Neisseria Gonorrhea: NEGATIVE

## 2014-10-29 ENCOUNTER — Encounter (HOSPITAL_COMMUNITY): Payer: Self-pay | Admitting: Emergency Medicine

## 2014-10-29 ENCOUNTER — Emergency Department (HOSPITAL_COMMUNITY): Payer: Medicaid Other

## 2014-10-29 ENCOUNTER — Emergency Department (HOSPITAL_COMMUNITY)
Admission: EM | Admit: 2014-10-29 | Discharge: 2014-10-29 | Disposition: A | Discharge status: Home / Self Care | Payer: Medicaid Other | Attending: Emergency Medicine | Admitting: Emergency Medicine

## 2014-10-29 DIAGNOSIS — Z720 Tobacco use: Secondary | ICD-10-CM | POA: Insufficient documentation

## 2014-10-29 DIAGNOSIS — Z79899 Other long term (current) drug therapy: Secondary | ICD-10-CM | POA: Insufficient documentation

## 2014-10-29 DIAGNOSIS — Z8719 Personal history of other diseases of the digestive system: Secondary | ICD-10-CM | POA: Insufficient documentation

## 2014-10-29 DIAGNOSIS — Z87442 Personal history of urinary calculi: Secondary | ICD-10-CM | POA: Insufficient documentation

## 2014-10-29 DIAGNOSIS — Z3202 Encounter for pregnancy test, result negative: Secondary | ICD-10-CM | POA: Insufficient documentation

## 2014-10-29 DIAGNOSIS — M545 Low back pain: Secondary | ICD-10-CM | POA: Insufficient documentation

## 2014-10-29 DIAGNOSIS — R109 Unspecified abdominal pain: Secondary | ICD-10-CM | POA: Diagnosis not present

## 2014-10-29 DIAGNOSIS — Z8659 Personal history of other mental and behavioral disorders: Secondary | ICD-10-CM | POA: Insufficient documentation

## 2014-10-29 DIAGNOSIS — M7918 Myalgia, other site: Secondary | ICD-10-CM

## 2014-10-29 LAB — URINALYSIS, ROUTINE W REFLEX MICROSCOPIC
BILIRUBIN URINE: NEGATIVE
Glucose, UA: NEGATIVE mg/dL
Hgb urine dipstick: NEGATIVE
Ketones, ur: NEGATIVE mg/dL
Leukocytes, UA: NEGATIVE
Nitrite: NEGATIVE
PH: 5.5 (ref 5.0–8.0)
Protein, ur: NEGATIVE mg/dL
Specific Gravity, Urine: 1.022 (ref 1.005–1.030)
UROBILINOGEN UA: 0.2 mg/dL (ref 0.0–1.0)

## 2014-10-29 LAB — I-STAT CHEM 8, ED
BUN: 12 mg/dL (ref 6–20)
CALCIUM ION: 1.11 mmol/L — AB (ref 1.12–1.23)
Chloride: 104 mmol/L (ref 101–111)
Creatinine, Ser: 0.7 mg/dL (ref 0.44–1.00)
GLUCOSE: 86 mg/dL (ref 65–99)
HCT: 40 % (ref 36.0–46.0)
HEMOGLOBIN: 13.6 g/dL (ref 12.0–15.0)
Potassium: 3.6 mmol/L (ref 3.5–5.1)
Sodium: 138 mmol/L (ref 135–145)
TCO2: 22 mmol/L (ref 0–100)

## 2014-10-29 LAB — POC URINE PREG, ED: Preg Test, Ur: NEGATIVE

## 2014-10-29 MED ORDER — TRAMADOL HCL 50 MG PO TABS: 50.0000 mg | ORAL_TABLET | Freq: Four times a day (QID) | ORAL | Status: DC | PRN

## 2014-10-29 MED ORDER — CYCLOBENZAPRINE HCL 10 MG PO TABS: 10.0000 mg | ORAL_TABLET | Freq: Two times a day (BID) | ORAL | Status: DC | PRN

## 2014-10-29 MED ORDER — OXYCODONE-ACETAMINOPHEN 5-325 MG PO TABS
1.0000 | ORAL_TABLET | Freq: Once | ORAL | Status: AC
  Administered 2014-10-29: 1 via ORAL
  Filled 2014-10-29: qty 1

## 2014-10-29 MED ORDER — NAPROXEN 375 MG PO TABS: 375.0000 mg | ORAL_TABLET | Freq: Two times a day (BID) | ORAL | Status: DC

## 2014-10-29 MED ORDER — ONDANSETRON HCL 4 MG/2ML IJ SOLN
4.0000 mg | Freq: Once | INTRAMUSCULAR | Status: DC | PRN
  Administered 2014-10-29: 4 mg via INTRAVENOUS
  Filled 2014-10-29: qty 2

## 2014-10-29 MED ORDER — MORPHINE SULFATE (PF) 4 MG/ML IV SOLN
4.0000 mg | Freq: Once | INTRAVENOUS | Status: DC | PRN
  Administered 2014-10-29: 4 mg via INTRAVENOUS
  Filled 2014-10-29: qty 1

## 2014-10-29 MED ORDER — ONDANSETRON 4 MG PO TBDP
4.0000 mg | ORAL_TABLET | Freq: Once | ORAL | Status: AC
  Administered 2014-10-29: 4 mg via ORAL
  Filled 2014-10-29: qty 1

## 2014-10-29 NOTE — ED Provider Notes (Signed)
CSN: [id number]     Arrival date & time 10/29/14  1443 History   First MD Initiated Contact with Patient 10/29/14 1713     Chief Complaint  Patient presents with  . Flank Pain     (Consider location/radiation/quality/duration/timing/severity/associated sxs/prior Treatment) HPI   Sue Clayton Is a 29 year old female with a past medical history of nephrolithiasis and cholecystitis. He presents emergency Department with chief complaint of left flank pain. The patient has been seen several times for the same complaint. She states that she was diagnosed with a kidney stone. She complains of left lower back pain which radiates to her flank. She says that it is constant, daily, worse with movement and twisting. She states that it hurts when she holds her urine. She has not taken anything for her pain. She denies colicky nature. She says that she has  Denies fevers, chills, myalgias, arthralgias. Denies DOE, SOB, chest tightness or pressure, radiation to left arm, jaw or back, or diaphoresis. Denies dysuria, flank pain, suprapubic pain, frequency, urgency, or hematuria. Denies headaches, light headedness, weakness, visual disturbances. Denies abdominal pain, nausea, vomiting, diarrhea or constipation.   Past Medical History  Diagnosis Date  . Anxiety   . Mental disorder   . Depression   . Kidney stones   . Gall stones    Past Surgical History  Procedure Laterality Date  . Cesarean section    . Lithotripsy     No family history on file. Social History  Substance Use Topics  . Smoking status: Current Every Day Smoker -- 1.00 packs/day    Types: Cigarettes  . Smokeless tobacco: None  . Alcohol Use: No   OB History    Gravida Para Term Preterm AB TAB SAB Ectopic Multiple Living   Review of Systems  Ten systems reviewed and are negative for acute change, except as noted in the HPI.    Allergies  Review of patient's allergies indicates no known  allergies.  Home Medications   Prior to Admission medications   Medication Sig Start Date End Date Taking? Authorizing Provider  HYDROcodone-acetaminophen (NORCO/VICODIN) 5-325 MG tablet Take 1 tablet by mouth every 6 (six) hours as needed for moderate pain.  09/26/14 09/26/15 Yes Historical Provider, MD  phentermine (ADIPEX-P) 37.5 MG tablet Take 37.5 mg by mouth daily. 08/22/14  Yes Historical Provider, MD   BP 113/69 mmHg  Pulse 102  Temp(Src) 98.1 F (36.7 C) (Oral)  Resp 12  SpO2 99%  LMP 10/01/2014 (Exact Date) Physical Exam  Constitutional: She is oriented to person, place, and time. She appears well-developed and well-nourished. No distress.  HENT:  Head: Normocephalic and atraumatic.  Eyes: Conjunctivae are normal. No scleral icterus.  Neck: Normal range of motion.  Cardiovascular: Normal rate, regular rhythm and normal heart sounds.  Exam reveals no gallop and no friction rub.   No murmur heard. Pulmonary/Chest: Effort normal and breath sounds normal. No respiratory distress.  Abdominal: Soft. Bowel sounds are normal. She exhibits no distension and no mass. There is no tenderness. There is no guarding.  No abdominal tenderness. No flank pain or CVA tenderness.  Musculoskeletal:       Arms: Neurological: She is alert and oriented to person, place, and time.  Skin: Skin is warm and dry. She is not diaphoretic.  Nursing note and vitals reviewed.   ED Course  Procedures (including critical care time) Labs Review Labs Reviewed  URINALYSIS, ROUTINE W REFLEX MICROSCOPIC (NOT AT [facility])  POC URINE PREG, ED    Imaging Review No results found. I have personally reviewed and evaluated these images and lab results as part of my medical decision-making.   EKG Interpretation None      MDM   Final diagnoses:  Flank pain  Lumbar muscle pain    Patient with kidney stone, no ureteral colic. Exam is most consistent with musculoskeletal pain. She has reproducible pain with  palpation of the lumbar paraspinal muscles. I tried explaining this to the patient. He states "I just don't get it." I tried explaining to the patient that medication would only help with her symptoms. However, she would need to make some lifestyle modifications and use stretching and other techniques. I'll refer the patient to sports medicine and physical therapy. She appears safe for discharge at this time. Patient seen in shared visit with attending physician, who agrees with plan of care.   Arthor CaptainAbigail Kasem Mozer, PA-C 11/01/14 0045  Elwin MochaBlair Walden, MD 11/01/14 [phone]

## 2014-10-29 NOTE — ED Notes (Signed)
[facility] where [facility] told her she had a kidney stone. Presents today with left side flank pain. Denies N/V/D no blood in urine. Alert/Oriented at triage.

## 2014-10-29 NOTE — Discharge Instructions (Signed)
You have a kidney stone inside the kidney. It is NOT the cause of your pain. You have musculoskeletal back pain and need to see a physical therapist. SEEK IMMEDIATE MEDICAL ATTENTION IF: New numbness, tingling, weakness, or problem with the use of your arms or legs.  Severe back pain not relieved with medications.  Change in bowel or bladder control.  Increasing pain in any areas of the body (such as chest or abdominal pain).  Shortness of breath, dizziness or fainting.  Nausea (feeling sick to your stomach), vomiting, fever, or sweats.  Chronic Back Pain  When back pain lasts longer than 3 months, it is called chronic back pain.People with chronic back pain often go through certain periods that are more intense (flare-ups).  CAUSES Chronic back pain can be caused by wear and tear (degeneration) on different structures in your back. These structures include:  The bones of your spine (vertebrae) and the joints surrounding your spinal cord and nerve roots (facets).  The strong, fibrous tissues that connect your vertebrae (ligaments). Degeneration of these structures may result in pressure on your nerves. This can lead to constant pain. HOME CARE INSTRUCTIONS  Avoid bending, heavy lifting, prolonged sitting, and activities which make the problem worse.  Take brief periods of rest throughout the day to reduce your pain. Lying down or standing usually is better than sitting while you are resting.  Take over-the-counter or prescription medicines only as directed by your caregiver. SEEK IMMEDIATE MEDICAL CARE IF:   You have weakness or numbness in one of your legs or feet.  You have trouble controlling your bladder or bowels.  You have nausea, vomiting, abdominal pain, shortness of breath, or fainting.   This information is not intended to replace advice given to you by your health care provider. Make sure you discuss any questions you have with your health care provider.   Document  Released: 01/28/2004 Document Revised: 03/14/2011 Document Reviewed: 06/09/2014 [employer] Interactive Patient Education 2016 [employer]  Back Exercises The following exercises strengthen the muscles that help to support the back. They also help to keep the lower back flexible. Doing these exercises can help to prevent back pain or lessen existing pain. If you have back pain or discomfort, try doing these exercises 2-3 times each day or as told by your health care provider. When the pain goes away, do them once each day, but increase the number of times that you repeat the steps for each exercise (do more repetitions). If you do not have back pain or discomfort, do these exercises once each day or as told by your health care provider. EXERCISES Single Knee to Chest Repeat these steps 3-5 times for each leg:  Lie on your back on a firm bed or the floor with your legs extended.  Bring one knee to your chest. Your other leg should stay extended and in contact with the floor.  Hold your knee in place by grabbing your knee or thigh.  Pull on your knee until you feel a gentle stretch in your lower back.  Hold the stretch for 10-30 seconds.  Slowly release and straighten your leg. Pelvic Tilt Repeat these steps 5-10 times:  Lie on your back on a firm bed or the floor with your legs extended.  Bend your knees so they are pointing toward the ceiling and your feet are flat on the floor.  Tighten your lower abdominal muscles to press your lower back against the floor. This motion will tilt your  pelvis so your tailbone points up toward the ceiling instead of pointing to your feet or the floor.  With gentle tension and even breathing, hold this position for 5-10 seconds. Cat-Cow Repeat these steps until your lower back becomes more flexible:  Get into a hands-and-knees position on a firm surface. Keep your hands under your shoulders, and keep your knees under your hips. You may place padding  under your knees for comfort.  Let your head hang down, and point your tailbone toward the floor so your lower back becomes rounded like the back of a cat.  Hold this position for 5 seconds.  Slowly lift your head and point your tailbone up toward the ceiling so your back forms a sagging arch like the back of a cow.  Hold this position for 5 seconds. Press-Ups Repeat these steps 5-10 times:  Lie on your abdomen (face-down) on the floor.  Place your palms near your head, about shoulder-width apart.  While you keep your back as relaxed as possible and keep your hips on the floor, slowly straighten your arms to raise the top half of your body and lift your shoulders. Do not use your back muscles to raise your upper torso. You may adjust the placement of your hands to make yourself more comfortable.  Hold this position for 5 seconds while you keep your back relaxed.  Slowly return to lying flat on the floor. Bridges Repeat these steps 10 times:  Lie on your back on a firm surface.  Bend your knees so they are pointing toward the ceiling and your feet are flat on the floor.  Tighten your buttocks muscles and lift your buttocks off of the floor until your waist is at almost the same height as your knees. You should feel the muscles working in your buttocks and the back of your thighs. If you do not feel these muscles, slide your feet 1-2 inches farther away from your buttocks.  Hold this position for 3-5 seconds.  Slowly lower your hips to the starting position, and allow your buttocks muscles to relax completely. If this exercise is too easy, try doing it with your arms crossed over your chest. Abdominal Crunches Repeat these steps 5-10 times:  Lie on your back on a firm bed or the floor with your legs extended.  Bend your knees so they are pointing toward the ceiling and your feet are flat on the floor.  Cross your arms over your chest.  Tip your chin slightly toward your chest  without bending your neck.  Tighten your abdominal muscles and slowly raise your trunk (torso) high enough to lift your shoulder blades a tiny bit off of the floor. Avoid raising your torso higher than that, because it can put too much stress on your low back and it does not help to strengthen your abdominal muscles.  Slowly return to your starting position. Back Lifts Repeat these steps 5-10 times: 1. Lie on your abdomen (face-down) with your arms at your sides, and rest your forehead on the floor. 2. Tighten the muscles in your legs and your buttocks. 3. Slowly lift your chest off of the floor while you keep your hips pressed to the floor. Keep the back of your head in line with the curve in your back. Your eyes should be looking at the floor. 4. Hold this position for 3-5 seconds. 5. Slowly return to your starting position. SEEK MEDICAL CARE IF:  Your back pain or discomfort gets much worse  when you do an exercise.  Your back pain or discomfort does not lessen within 2 hours after you exercise. If you have any of these problems, stop doing these exercises right away. Do not do them again unless your health care provider says that you can. SEEK IMMEDIATE MEDICAL CARE IF:  You develop sudden, severe back pain. If this happens, stop doing the exercises right away. Do not do them again unless your health care provider says that you can.   This information is not intended to replace advice given to you by your health care provider. Make sure you discuss any questions you have with your health care provider.   Document Released: 01/28/2004 Document Revised: 09/10/2014 Document Reviewed: 02/13/2014 Elsevier Interactive Patient Education 2016 [place] Injury Prevention Back injuries can be very painful. They can also be difficult to heal. After having one back injury, you are more likely to injure your back again. It is important to learn how to avoid injuring or re-injuring your  back. The following tips can help you to prevent a back injury. WHAT SHOULD I KNOW ABOUT PHYSICAL FITNESS?  Exercise for 30 minutes per day on most days of the week or as directed by your health care provider. Make sure to:  Do aerobic exercises, such as walking, jogging, biking, or swimming.  Do exercises that increase balance and strength, such as tai chi and yoga. These can decrease your risk of falling and injuring your back.  Do stretching exercises to help with flexibility.  Try to develop strong abdominal muscles. Your abdominal muscles provide a lot of the support that is needed by your back.  Maintain a healthy weight. This helps to decrease your risk of a back injury. WHAT SHOULD I KNOW ABOUT MY DIET?  Talk with your health care provider about your overall diet. Take supplements and vitamins only as directed by your health care provider.  Talk with your health care provider about how much calcium and vitamin D you need each day. These nutrients help to prevent weakening of the bones (osteoporosis). Osteoporosis can cause broken (fractured) bones, which lead to back pain.  Include good sources of calcium in your diet, such as dairy products, green leafy vegetables, and products that have had calcium added to them (fortified).  Include good sources of vitamin D in your diet, such as milk and foods that are fortified with vitamin D. WHAT SHOULD I KNOW ABOUT MY POSTURE?  Sit up straight and stand up straight. Avoid leaning forward when you sit or hunching over when you stand.  Choose chairs that have good low-back (lumbar) support.  If you work at a desk, sit close to it so you do not need to lean over. Keep your chin tucked in. Keep your neck drawn back, and keep your elbows bent at a right angle. Your arms should look like the letter "L."  Sit high and close to the steering wheel when you drive. Add a lumbar support to your car seat, if needed.  Avoid sitting or standing in  one position for very long. Take breaks to get up, stretch, and walk around at least one time every hour. Take breaks every hour if you are driving for long periods of time.  Sleep on your side with your knees slightly bent, or sleep on your back with a pillow under your knees. Do not lie on the front of your body to sleep. WHAT SHOULD I KNOW ABOUT LIFTING, TWISTING, AND REACHING?  Lifting and Heavy Lifting  Avoid heavy lifting, especially repetitive heavy lifting. If you must do heavy lifting:  Stretch before lifting.  Work slowly.  Rest between lifts.  Use a tool such as a cart or a dolly to move objects if one is available.  Make several small trips instead of carrying one heavy load.  Ask for help when you need it, especially when moving big objects.  Follow these steps when lifting:  Stand with your feet shoulder-width apart.  Get as close to the object as you can. Do not try to pick up a heavy object that is far from your body.  Use handles or lifting straps if they are available.  Bend at your knees. Squat down, but keep your heels off the floor.  Keep your shoulders pulled back, your chin tucked in, and your back straight.  Lift the object slowly while you tighten the muscles in your legs, abdomen, and buttocks. Keep the object as close to the center of your body as possible.  Follow these steps when putting down a heavy load:  Stand with your feet shoulder-width apart.  Lower the object slowly while you tighten the muscles in your legs, abdomen, and buttocks. Keep the object as close to the center of your body as possible.  Keep your shoulders pulled back, your chin tucked in, and your back straight.  Bend at your knees. Squat down, but keep your heels off the floor.  Use handles or lifting straps if they are available. Twisting and Reaching  Avoid lifting heavy objects above your waist.  Do not twist at your waist while you are lifting or carrying a load. If  you need to turn, move your feet.  Do not bend over without bending at your knees.  Avoid reaching over your head, across a table, or for an object on a high surface. WHAT ARE SOME OTHER TIPS?  Avoid wet floors and icy ground. Keep sidewalks clear of ice to prevent falls.  Do not sleep on a mattress that is too soft or too hard.  Keep items that are used frequently within easy reach.  Put heavier objects on shelves at waist level, and put lighter objects on lower or higher shelves.  Find ways to decrease your stress, such as exercise, massage, or relaxation techniques. Stress can build up in your muscles. Tense muscles are more vulnerable to injury.  Talk with your health care provider if you feel anxious or depressed. These conditions can make back pain worse.  Wear flat heel shoes with cushioned soles.  Avoid sudden movements.  Use both shoulder straps when carrying a backpack.  Do not use any tobacco products, including cigarettes, chewing tobacco, or electronic cigarettes. If you need help quitting, ask your health care provider.   This information is not intended to replace advice given to you by your health care provider. Make sure you discuss any questions you have with your health care provider.   Document Released: 01/28/2004 Document Revised: 05/06/2014 Document Reviewed: 12/24/2013 [employer] Interactive Patient Education [employer].

## 2014-12-20 ENCOUNTER — Inpatient Hospital Stay (HOSPITAL_COMMUNITY): Payer: Medicaid Other

## 2014-12-20 ENCOUNTER — Encounter (HOSPITAL_COMMUNITY): Payer: Self-pay | Admitting: *Deleted

## 2014-12-20 ENCOUNTER — Inpatient Hospital Stay (HOSPITAL_COMMUNITY)
Admission: AD | Admit: 2014-12-20 | Discharge: 2014-12-20 | Disposition: A | Discharge status: Home / Self Care | Payer: Medicaid Other | Source: Ambulatory Visit | Attending: Obstetrics & Gynecology | Admitting: Obstetrics & Gynecology

## 2014-12-20 DIAGNOSIS — R3129 Other microscopic hematuria: Secondary | ICD-10-CM

## 2014-12-20 DIAGNOSIS — R112 Nausea with vomiting, unspecified: Secondary | ICD-10-CM

## 2014-12-20 DIAGNOSIS — N200 Calculus of kidney: Secondary | ICD-10-CM | POA: Diagnosis not present

## 2014-12-20 DIAGNOSIS — N1330 Unspecified hydronephrosis: Secondary | ICD-10-CM | POA: Diagnosis not present

## 2014-12-20 DIAGNOSIS — Z87442 Personal history of urinary calculi: Secondary | ICD-10-CM | POA: Insufficient documentation

## 2014-12-20 DIAGNOSIS — R1032 Left lower quadrant pain: Secondary | ICD-10-CM | POA: Diagnosis present

## 2014-12-20 LAB — URINE MICROSCOPIC-ADD ON: Bacteria, UA: NONE SEEN

## 2014-12-20 LAB — COMPREHENSIVE METABOLIC PANEL
ALBUMIN: 4.1 g/dL (ref 3.5–5.0)
ALT: 23 U/L (ref 14–54)
AST: 23 U/L (ref 15–41)
Alkaline Phosphatase: 64 U/L (ref 38–126)
Anion gap: 10 (ref 5–15)
BILIRUBIN TOTAL: 0.8 mg/dL (ref 0.3–1.2)
BUN: 10 mg/dL (ref 6–20)
CALCIUM: 9.3 mg/dL (ref 8.9–10.3)
CO2: 21 mmol/L — ABNORMAL LOW (ref 22–32)
Chloride: 108 mmol/L (ref 101–111)
Creatinine, Ser: 0.69 mg/dL (ref 0.44–1.00)
GFR calc Af Amer: >60 mL/min (ref >60)
GLUCOSE: 100 mg/dL — AB (ref 65–99)
Potassium: 3.7 mmol/L (ref 3.5–5.1)
Sodium: 139 mmol/L (ref 135–145)
TOTAL PROTEIN: 8.4 g/dL — AB (ref 6.5–8.1)

## 2014-12-20 LAB — CBC
HEMATOCRIT: 36.8 % (ref 36.0–46.0)
HEMOGLOBIN: 12.3 g/dL (ref 12.0–15.0)
MCH: 27 pg (ref 26.0–34.0)
MCHC: 33.4 g/dL (ref 30.0–36.0)
MCV: 80.7 fL (ref 78.0–100.0)
Platelets: 291 10*3/uL (ref 150–400)
RBC: 4.56 MIL/uL (ref 3.87–5.11)
RDW: 14.6 % (ref 11.5–15.5)
WBC: 7.4 10*3/uL (ref 4.0–10.5)

## 2014-12-20 LAB — URINALYSIS, ROUTINE W REFLEX MICROSCOPIC
BILIRUBIN URINE: NEGATIVE
Glucose, UA: NEGATIVE mg/dL
Ketones, ur: NEGATIVE mg/dL
LEUKOCYTES UA: NEGATIVE
NITRITE: NEGATIVE
PH: 6 (ref 5.0–8.0)
Protein, ur: NEGATIVE mg/dL
SPECIFIC GRAVITY, URINE: 1.02 (ref 1.005–1.030)

## 2014-12-20 LAB — POCT PREGNANCY, URINE: PREG TEST UR: NEGATIVE

## 2014-12-20 MED ORDER — IBUPROFEN 600 MG PO TABS: 600.0000 mg | ORAL_TABLET | Freq: Four times a day (QID) | ORAL | Status: DC | PRN

## 2014-12-20 MED ORDER — TRAMADOL HCL 50 MG PO TABS: 50.0000 mg | ORAL_TABLET | Freq: Four times a day (QID) | ORAL | Status: DC | PRN

## 2014-12-20 MED ORDER — LACTATED RINGERS IV SOLN: INTRAVENOUS | Status: DC

## 2014-12-20 MED ORDER — TAMSULOSIN HCL 0.4 MG PO CAPS: 0.4000 mg | ORAL_CAPSULE | Freq: Every day | ORAL | Status: DC

## 2014-12-20 MED ORDER — ONDANSETRON 8 MG PO TBDP
8.0000 mg | ORAL_TABLET | Freq: Once | ORAL | Status: AC
  Administered 2014-12-20: 8 mg via ORAL
  Filled 2014-12-20: qty 1

## 2014-12-20 MED ORDER — KETOROLAC TROMETHAMINE 60 MG/2ML IM SOLN
60.0000 mg | Freq: Once | INTRAMUSCULAR | Status: AC
  Administered 2014-12-20: 60 mg via INTRAMUSCULAR
  Filled 2014-12-20: qty 2

## 2014-12-20 NOTE — MAU Provider Note (Addendum)
History     CSN: [id number]  Arrival date and time: 12/20/14 [id number]   First Provider Initiated Contact with Patient 12/20/14 [phone]         Chief Complaint  Patient presents with  . Abdominal Pain  . Emesis   HPI Sue Clayton is a 29 y.o. female who presents for abdominal pain & n/v.  Patient reports LLQ pain x 2 days that became worse last night. States pain is sharp & intermittent. Rates as 10/10. Has not treated.  Also reports n/v since yesterday that she believes is d/t the pain.  Has vomited 4 times in the last 24 hours.  Denies fever or diarrhea.  Reports some constipation. Last BM was yesterday.  Some brown/red vaginal spotting that is normal for her d/t depo injections.   Patient was seen at her Urologist on 12/7 and they told her this pain should subside.   Patient feels that her urine stream is slower, however the patient is able to urinate.   OB History    Gravida Para Term Preterm AB TAB SAB Ectopic Multiple Living   8 4 4  3 3    4       Past Medical History  Diagnosis Date  . Anxiety   . Mental disorder   . Depression   . Kidney stones   . Gall stones     Past Surgical History  Procedure Laterality Date  . Cesarean section    . Lithotripsy      History reviewed. No pertinent family history.  Social History  Substance Use Topics  . Smoking status: Current Every Day Smoker -- 1.00 packs/day    Types: Cigarettes  . Smokeless tobacco: None  . Alcohol Use: No    Allergies: No Known Allergies  Prescriptions prior to admission  Medication Sig Dispense Refill Last Dose  . cyclobenzaprine (FLEXERIL) 10 MG tablet Take 1 tablet (10 mg total) by mouth 2 (two) times daily as needed for muscle spasms. 20 tablet 0   . dicyclomine (BENTYL) 20 MG tablet Take 20 mg by mouth 4 (four) times daily.  0   . naproxen (NAPROSYN) 375 MG tablet Take 1 tablet (375 mg total) by mouth 2 (two) times daily. 20 tablet 0   . phentermine (ADIPEX-P) 37.5 MG tablet Take  37.5 mg by mouth daily.   Past Week at Unknown time  . traMADol (ULTRAM) 50 MG tablet Take 1 tablet (50 mg total) by mouth every 6 (six) hours as needed for severe pain. 15 tablet 0     Review of Systems  Constitutional: Negative.   Gastrointestinal: Positive for nausea, vomiting, abdominal pain and constipation. Negative for diarrhea.  Genitourinary: Negative.    Physical Exam   Blood pressure 143/95, pulse 104, temperature 98 F (36.7 C), resp. rate 20, height 5\' 7"  (1.702 m), weight 214 lb 12.8 oz (97.433 kg), SpO2 100 %, unknown if currently breastfeeding.  Physical Exam  Nursing note and vitals reviewed. Constitutional: She is oriented to person, place, and time. She appears well-developed and well-nourished. She appears distressed.  HENT:  Head: Normocephalic and atraumatic.  Eyes: Conjunctivae are normal. Right eye exhibits no discharge. Left eye exhibits no discharge. No scleral icterus.  Neck: Normal range of motion.  Cardiovascular: Normal rate, regular rhythm and normal heart sounds.   No murmur heard. Respiratory: Effort normal and breath sounds normal. No respiratory distress. She has no wheezes.  GI: Soft. Bowel sounds are normal. She exhibits no distension and no  mass. There is tenderness. There is no rebound, no guarding and no CVA tenderness.  Neurological: She is alert and oriented to person, place, and time.  Skin: Skin is warm and dry. She is not diaphoretic.  Psychiatric: She has a normal mood and affect. Her behavior is normal. Judgment and thought content normal.    MAU Course  Procedures Results for orders placed or performed during the hospital encounter of 12/20/14 (from the past 24 hour(s))  Urinalysis, Routine w reflex microscopic (not at [facility])     Status: Abnormal   Collection Time: 12/20/14  6:06 AM  Result Value Ref Range   Color, Urine YELLOW YELLOW   APPearance CLEAR CLEAR   Specific Gravity, Urine 1.020 1.005 - 1.030   pH 6.0 5.0 - 8.0    Glucose, UA NEGATIVE NEGATIVE mg/dL   Hgb urine dipstick MODERATE (A) NEGATIVE   Bilirubin Urine NEGATIVE NEGATIVE   Ketones, ur NEGATIVE NEGATIVE mg/dL   Protein, ur NEGATIVE NEGATIVE mg/dL   Nitrite NEGATIVE NEGATIVE   Leukocytes, UA NEGATIVE NEGATIVE  Urine microscopic-add on     Status: Abnormal   Collection Time: 12/20/14  6:06 AM  Result Value Ref Range   Squamous Epithelial / LPF 0-5 (A) NONE SEEN   WBC, UA 0-5 0 - 5 WBC/hpf   RBC / HPF 6-30 0 - 5 RBC/hpf   Bacteria, UA NONE SEEN NONE SEEN   Urine-Other MUCOUS PRESENT   Pregnancy, urine POC     Status: None   Collection Time: 12/20/14  6:14 AM  Result Value Ref Range   Preg Test, Ur NEGATIVE NEGATIVE  CBC     Status: None   Collection Time: 12/20/14  6:31 AM  Result Value Ref Range   WBC 7.4 4.0 - 10.5 K/uL   RBC 4.56 3.87 - 5.11 MIL/uL   Hemoglobin 12.3 12.0 - 15.0 g/dL   HCT 16.1 09.6 - 04.5 %   MCV 80.7 78.0 - 100.0 fL   MCH 27.0 26.0 - 34.0 pg   MCHC 33.4 30.0 - 36.0 g/dL   RDW 40.9 81.1 - 91.4 %   Platelets 291 150 - 400 K/uL  Comprehensive metabolic panel     Status: Abnormal   Collection Time: 12/20/14  6:31 AM  Result Value Ref Range   Sodium 139 135 - 145 mmol/L   Potassium 3.7 3.5 - 5.1 mmol/L   Chloride 108 101 - 111 mmol/L   CO2 21 (L) 22 - 32 mmol/L   Glucose, Bld 100 (H) 65 - 99 mg/dL   BUN 10 6 - 20 mg/dL   Creatinine, Ser 7.82 0.44 - 1.00 mg/dL   Calcium 9.3 8.9 - 95.6 mg/dL   Total Protein 8.4 (H) 6.5 - 8.1 g/dL   Albumin 4.1 3.5 - 5.0 g/dL   AST 23 15 - 41 U/L   ALT 23 14 - 54 U/L   Alkaline Phosphatase 64 38 - 126 U/L   Total Bilirubin 0.8 0.3 - 1.2 mg/dL   GFR calc non Af Amer >60 >60 mL/min   GFR calc Af Amer >60 >60 mL/min   Anion gap 10 5 - 15   US Transvaginal Non-ob  12/20/2014  CLINICAL DATA:  Patient with left lower quadrant pain for 2 days. Intermittent. EXAM: TRANSABDOMINAL AND TRANSVAGINAL ULTRASOUND OF PELVIS TECHNIQUE: Both transabdominal and transvaginal ultrasound  examinations of the pelvis were performed. Transabdominal technique was performed for global imaging of the pelvis including uterus, ovaries, adnexal regions, and pelvic cul-de-sac. It  was necessary to proceed with endovaginal exam following the transabdominal exam to visualize the endometrium and adnexal structures. COMPARISON:  CT abdomen pelvis 10/29/2014. FINDINGS: Uterus Measurements: 8.4 x 4.4 x 4.8 cm. No fibroids or other mass visualized. Endometrium Thickness: 5 mm.  No focal abnormality visualized. Right ovary Measurements: 2.2 x 1.5 x 1.7 cm. Normal appearance/no adnexal mass. Left ovary Measurements: 2.1 x 1.6 x 1.8 cm. Normal appearance/no adnexal mass. Other findings No free fluid. IMPRESSION: Unremarkable pelvic ultrasound. Electronically Signed   By: Annia Belt M.D.   On: 12/20/2014 07:41   US Pelvis Complete  12/20/2014  CLINICAL DATA:  Patient with left lower quadrant pain for 2 days. Intermittent. EXAM: TRANSABDOMINAL AND TRANSVAGINAL ULTRASOUND OF PELVIS TECHNIQUE: Both transabdominal and transvaginal ultrasound examinations of the pelvis were performed. Transabdominal technique was performed for global imaging of the pelvis including uterus, ovaries, adnexal regions, and pelvic cul-de-sac. It was necessary to proceed with endovaginal exam following the transabdominal exam to visualize the endometrium and adnexal structures. COMPARISON:  CT abdomen pelvis 10/29/2014. FINDINGS: Uterus Measurements: 8.4 x 4.4 x 4.8 cm. No fibroids or other mass visualized. Endometrium Thickness: 5 mm.  No focal abnormality visualized. Right ovary Measurements: 2.2 x 1.5 x 1.7 cm. Normal appearance/no adnexal mass. Left ovary Measurements: 2.1 x 1.6 x 1.8 cm. Normal appearance/no adnexal mass. Other findings No free fluid. IMPRESSION: Unremarkable pelvic ultrasound. Electronically Signed   By: Annia Belt M.D.   On: 12/20/2014 07:41   US Renal  12/20/2014  CLINICAL DATA:  Left lower quadrant pain for 2 days  EXAM: RENAL / URINARY TRACT ULTRASOUND COMPLETE COMPARISON:  10/29/2014 FINDINGS: Right Kidney: Length: 10.8 cm. Minimal fullness of the collecting system is noted. Left Kidney: Length: 11.1 cm.  Moderate hydronephrosis is seen. Bladder: Decompressed IMPRESSION: Moderate left hydronephrosis is noted. A previously noted left renal stone is not well appreciated and may have migrated into left ureter. CT urography may be helpful for further evaluation. Electronically Signed   By: Alcide Clever M.D.   On: 12/20/2014 07:43   Ct Renal Stone Study  12/20/2014  CLINICAL DATA:  Patient with left lower quadrant pain for 2 days, worsening last night. Pain is sharp and intermittent. EXAM: CT ABDOMEN AND PELVIS WITHOUT CONTRAST TECHNIQUE: Multidetector CT imaging of the abdomen and pelvis was performed following the standard protocol without IV contrast. COMPARISON:  CT abdomen pelvis 10/29/2014 FINDINGS: Lower chest: No consolidative pulmonary opacities. No pleural effusion. Normal heart size. Hepatobiliary: The liver is normal in size and contour. Small stones within the gallbladder lumen. Pancreas: Unremarkable Spleen: Unremarkable Adrenals/Urinary Tract: The adrenal glands are normal. There is mild left hydronephrosis. There is a small amount of fat stranding about the left ureter. There is a 5 mm stone within the left renal collecting system (image 24; series 2). Previously visualized 2 mm stone within the superior pole of the left kidney is no longer visualized. No right-sided hydronephrosis. Urinary bladder is unremarkable. Punctate calcification within the pelvis (image 67; series 2) adjacent to left ureter is favored represent a phlebolith. Tiny distal ureteral stone not entirely excluded. Stomach/Bowel: No abnormal bowel wall thickening or evidence for bowel obstruction. No free fluid or free intraperitoneal air. Small amount of free fluid in the pelvis. Normal morphology of the stomach. Appendix is not visualized.  No right lower quadrant inflammatory stranding. Vascular/Lymphatic: Normal caliber abdominal aorta. No retroperitoneal lymphadenopathy. Other: Uterus is unremarkable. Musculoskeletal: Stable postsurgical changes involving the midline abdomen. No aggressive or acute appearing  osseous lesions. IMPRESSION: There is mild left hydronephrosis as well as stranding about the left ureter. Previously visualized 2 mm stone within the superior pole of the left kidney is no longer visualized and may have passed in the interval. Punctate calcification adjacent to the distal left ureter is favored to represent a phlebolith. Tiny small distal ureteral stone not entirely excluded. There is a 5 mm stone within the left renal collecting system. Electronically Signed   By: Annia Belt M.D.   On: 12/20/2014 09:39    MDM Toradol IM & zofran odt given Per review of records - patient is being followed for kidney stones. Will order renal ultrasound in addition to pelvic ultrasound.  CT abdomen ordered to evaluate hydronephrosis Pain improved with toradol  0810- Care turned over to [facility] FNP             Judeth Horn, NP 12/20/2014 8:13 AM   Patient asleep with lights out in the room when I presented to discussed CT findings.  CT scan results reviewed with Dr. Debroah Loop  Assessment and Plan   A:  1. LLQ abdominal pain   2. Non-intractable vomiting with nausea, unspecified vomiting type   3. Microscopic hematuria   4. Hydronephrosis   5. Recurrent nephrolithiasis    P:  Discharge home in stable condition RX: Flomax, Ultram, ibuprofen  Patient to call Urologist on Monday If symptoms worsen over the weekend patient should go to [facility], [facility], or [facility] Urine culture pending   Duane Lope, NP 12/20/2014 1:49 PM Attestation of Attending Supervision of Advanced Practitioner (CNM/NP): Evaluation and management procedures were performed by the Advanced Practitioner under my  supervision and collaboration. I have reviewed the Advanced Practitioner's note and chart, and I agree with the management and plan.  Scheryl Darter MD

## 2014-12-20 NOTE — Discharge Instructions (Signed)
Hydronephrosis  Hydronephrosis is the enlargement of a kidney due to a blockage that stops urine from flowing out of the body.  CAUSES  Common causes of this condition include:  · A birth (congenital) defect of the kidney.  · A congenital defect of the tube through which urine travels (ureter).  · Kidney stones.  · An enlarged prostate gland.  · A tumor.  · Cancer of the prostate, bladder, uterus, ovary, or colon.  · A blood clot.  SYMPTOMS  Symptoms of this condition include:  · Pain or discomfort in your side (flank).  · Swelling of the abdomen.  · Pain in the abdomen.  · Nausea and vomiting.  · Fever.  · Pain while passing urine.  · Feeling of urgency to urinate.  · Frequent urination.  · Infection of the urinary tract.  In some cases, there are no symptoms.  DIAGNOSIS  This condition may be diagnosed with:  · A medical history.  · A physical exam.  · Blood and urine tests to check kidney function.  · Imaging tests, such as an X-ray, ultrasound, CT scan, or MRI.  · A test in which a rigid or flexible telescope (cystoscope) is used to view the site of the blockage.  TREATMENT  Treatment for this condition depends on where the blockage is located, how long it has been there, and what caused it. The goal of treatment is to remove the blockage. Treatment options include:  · A procedure to put in a soft tube to help drain urine.  · Antibiotic medicines to treat or prevent infection.  · Shock-wave therapy (lithotripsy) to help eliminate kidney stones.  HOME CARE INSTRUCTIONS  · Get lots of rest.  · Drink enough fluid to keep your urine clear or pale yellow.  · If you have a drain in, follow your health care provider's instructions about how to care for it.  · Take medicines only as directed by your health care provider.  · If you were prescribed an antibiotic medicine, finish all of it even if you start to feel better.  · Keep all follow-up visits as directed by your health care provider. This is important.  SEEK  MEDICAL CARE IF:  · You continue to have symptoms after treatment.  · You develop new symptoms.  · You have a problem with a drainage device.  · Your urine becomes cloudy or bloody.  · You have a fever.  SEEK IMMEDIATE MEDICAL CARE IF:  · You have severe flank or abdominal pain.  · You develop vomiting and are unable to keep fluids down.     This information is not intended to replace advice given to you by your health care provider. Make sure you discuss any questions you have with your health care provider.     Document Released: 10/17/2006 Document Revised: 05/06/2014 Document Reviewed: 12/16/2013  Elsevier Interactive Patient Education ©2016 [employer]

## 2014-12-20 NOTE — MAU Note (Addendum)
Pain in LLQ for 2 days and getting worse. "numbing" pain. Pain comes and goes. N/v but no diarrhea and thinks vomiting due to pain. Spotting off and on and thinks due to Depo. Next shot due Jan 23rd. Pt crying in pain in Triage

## 2014-12-21 LAB — URINE CULTURE: Culture: 8000

## 2014-12-30 ENCOUNTER — Other Ambulatory Visit: Payer: Self-pay | Admitting: Urology

## 2015-01-01 ENCOUNTER — Encounter (HOSPITAL_COMMUNITY): Payer: Self-pay | Admitting: *Deleted

## 2015-01-06 NOTE — H&P (Signed)
Reason For Visit Lt renal colic   History of Present Illness    30 YO female, last seen 01/12/11, referred back by Yevonne PaxAlison Ervin, PA for further evaluation of Lt renal colic. No gross hematuria. She is P4-4-0.     She has a previous hx of kidney stones, s/p (L) ESWL 11/22/10.   Past Medical History Problems  1. History of Anxiety (F41.9) 2. History of depression (Z86.59)  Surgical History Problems  1. History of Cesarean Section 2. History of Renal Lithotripsy  Current Meds 1. No Reported Medications  Requested for: 07Dec2016 Recorded  Allergies Medication  1. No Known Drug Allergies  Family History Problems  1. Family history of Death In The Family Mother   30 yo complications to Gastric Bypass surgery 2. Family history of Family Health Status - Father's Age 25. Family history of Family Health Status Number Of Children   [id number]  Social History Problems    Alcohol Use (History)   occasional   Denied: History of Caffeine Use   Marital History - Single   Tobacco use (Z72.0)   1 pack every 3 days   Unemployed  Review of Systems Genitourinary, constitutional, skin, eye, otolaryngeal, hematologic/lymphatic, cardiovascular, pulmonary, endocrine, musculoskeletal, gastrointestinal, neurological and psychiatric system(s) were reviewed and pertinent findings if present are noted and are otherwise negative.  Genitourinary: hematuria.  Gastrointestinal: nausea.  Musculoskeletal: back pain.    Vitals Vital Signs [Data Includes: Last 1 Day]  Recorded: 07Dec2016 01:16PM  Height: 5 ft 7 in Weight: 217 lb  BMI Calculated: 33.99 BSA Calculated: 2.09 Blood Pressure: 120 / 71 Heart Rate: 73  Physical Exam Constitutional: Well nourished and well developed . No acute distress.  ENT:. The ears and nose are normal in appearance.  Neck: The appearance of the neck is normal and no neck mass is present.  Pulmonary: No respiratory distress and normal respiratory  rhythm and effort.  Cardiovascular: Heart rate and rhythm are normal . No peripheral edema.  Abdomen: The abdomen is obese. The abdomen is soft and nontender. No masses are palpated. No CVA tenderness. No hernias are palpable. No hepatosplenomegaly noted.  Lymphatics: The femoral and inguinal nodes are not enlarged or tender.  Skin: Normal skin turgor, no visible rash and no visible skin lesions.  Neuro/Psych:. Mood and affect are appropriate.    Results/Data  Urine [Data Includes: Last 1 Day]   07Dec2016  COLOR YELLOW   APPEARANCE CLEAR   SPECIFIC GRAVITY 1.010   pH 7.0   GLUCOSE NEGATIVE   BILIRUBIN NEGATIVE   KETONE NEGATIVE   BLOOD 2+   PROTEIN NEGATIVE   NITRITE NEGATIVE   LEUKOCYTE ESTERASE NEGATIVE   SQUAMOUS EPITHELIAL/HPF 6-10 HPF  WBC NONE SEEN WBC/HPF  RBC 3-10 RBC/HPF  BACTERIA NONE SEEN HPF  CRYSTALS NONE SEEN HPF  CASTS NONE SEEN LPF  Yeast NONE SEEN HPF   10 Dec 2014 12:52 PM  UA With REFLEX    COLOR YELLOW     APPEARANCE CLEAR     SPECIFIC GRAVITY 1.010     pH 7.0     GLUCOSE NEGATIVE     BILIRUBIN NEGATIVE     KETONE NEGATIVE     BLOOD 2+     PROTEIN NEGATIVE     NITRITE NEGATIVE     LEUKOCYTE ESTERASE NEGATIVE     WBC NONE SEEN     RBC 3-10     SQUAMOUS EPITHELIAL/HPF 6-10     BACTERIA NONE SEEN  CRYSTALS NONE SEEN     CASTS NONE SEEN     Yeast NONE SEEN   Assessment Assessed  1. Irritable bowel syndrome with diarrhea (K58.0)  Review of the CT at [facility] ( stone protocol) shows ( 1) 4mm imbedded L mid pole stone. Stone is not in the collecting systim. No hydronephrosis. Ro Right sided sotnes  (2) incidental gall stones.   (3) marked gaseous distention of the colon, right, transverse.    I do not think Ms Virl Son is having Left flank pain based on renal colic. Her pain distribution is from the L flank up to the L chest. her CT shows a 4mm stone, not in the collecting system. She has gall stones, but no hx of GI distress with fried foods or  vegetables. He u/a has no hematuria in our office, and she notes no hx of hematuria in other offices.     I think her pain is GI based, and would return her to [facility] for Rx. I will give her Bentyl, 20mg  qid  as a trial   Plan Irritable bowel syndrome with diarrhea, Kidney stone on left side  1. Start: Dicyclomine HCl - 20 MG Oral Tablet; TAKE 1 TABLET 4 times daily  bentyl, 20mg  qid as a trial. F/u per [facility]   Discussion/Summary     Signatures Electronically signed by : Jethro Bolus, M.D.; Dec 10 2014  2:08PM EST  Add: Pt seen in ED 12/17 - repeat CT showed 2 mm left renal stone passed and 5 mm stone remains in left collecting system (Dr T reviewed CT). I reviewed CT. F/u urine cx grew 8k insig growth. No bac was seen on ED or office UA. I reviewed notes, labs, images.

## 2015-01-07 ENCOUNTER — Other Ambulatory Visit: Payer: Self-pay | Admitting: Urology

## 2015-01-12 ENCOUNTER — Ambulatory Visit (HOSPITAL_COMMUNITY): Admission: RE | Admit: 2015-01-12 | Payer: Medicaid Other | Source: Ambulatory Visit | Admitting: Urology

## 2015-01-12 SURGERY — LITHOTRIPSY, ESWL
Anesthesia: LOCAL | Laterality: Left

## 2015-05-12 ENCOUNTER — Ambulatory Visit (HOSPITAL_COMMUNITY)
Admission: EM | Admit: 2015-05-12 | Discharge: 2015-05-12 | Disposition: A | Discharge status: Home / Self Care | Payer: Medicaid Other | Attending: Family Medicine | Admitting: Family Medicine

## 2015-05-12 ENCOUNTER — Encounter (HOSPITAL_COMMUNITY): Payer: Self-pay | Admitting: Emergency Medicine

## 2015-05-12 DIAGNOSIS — K047 Periapical abscess without sinus: Secondary | ICD-10-CM | POA: Diagnosis not present

## 2015-05-12 MED ORDER — NAPROXEN 500 MG PO TABS
500.0000 mg | ORAL_TABLET | Freq: Two times a day (BID) | ORAL | Status: DC
Start: 2015-05-12 — End: 2015-07-01

## 2015-05-12 MED ORDER — KETOROLAC TROMETHAMINE 60 MG/2ML IM SOLN
INTRAMUSCULAR | Status: AC
  Filled 2015-05-12: qty 2

## 2015-05-12 MED ORDER — AMOXICILLIN-POT CLAVULANATE 875-125 MG PO TABS: 1.0000 | ORAL_TABLET | Freq: Two times a day (BID) | ORAL | Status: DC

## 2015-05-12 MED ORDER — KETOROLAC TROMETHAMINE 60 MG/2ML IM SOLN
60.0000 mg | Freq: Once | INTRAMUSCULAR | Status: AC
  Administered 2015-05-12: 60 mg via INTRAMUSCULAR

## 2015-05-12 MED ORDER — OXYCODONE-ACETAMINOPHEN 5-325 MG PO TABS: 1.0000 | ORAL_TABLET | Freq: Three times a day (TID) | ORAL | Status: DC | PRN

## 2015-05-12 NOTE — ED Notes (Signed)
Left, top tooth pain.  Pain for 2 weeks.  Tooth broke recently.  Patient reports her dentist cannot see her until next week.

## 2015-05-12 NOTE — Discharge Instructions (Signed)
It is a pleasure to see you today.    For the dental infection, you are being given an antibiotic, AUGMENTIN, take 1 tablet by mouth twice daily.   For the pain, NAPROXEN 500mg , take 1 tablet by mouth twice daily with food.   Percocet 5-325, take 1 tablet by mouth every 8 hours as needed for extreme pain.   Keep your dentist appointment for next Thursday, May 18th.   Return to the urgent care center, emergency room, or your primary care provider if you experience fevers/chills, worsening pain, inability to eat/drink, or with other concerns.

## 2015-05-12 NOTE — ED Provider Notes (Signed)
CSN: [id number]     Arrival date & time 05/12/15  1857 History   First MD Initiated Contact with Patient 05/12/15 2024     Chief Complaint  Patient presents with  . Dental Pain   (Consider location/radiation/quality/duration/timing/severity/associated sxs/prior Treatment) Patient is a 30 y.o. female presenting with tooth pain. The history is provided by the patient. No language interpreter was used.  Dental Pain  Patient with complaint of L upper molar pain x 2 days, severe.  Unable to chew on L side of mouth, is taking liquids.  No fevers or chills. Made appt with her dentist for May 18th.  Taking ibuprofen without relief.   ROS: No fevers or chills, no cough, no chest pain, no abd pain, no N/V/D. Past Medical History  Diagnosis Date  . Anxiety   . Mental disorder   . Depression   . Kidney stones   . Gall stones    Past Surgical History  Procedure Laterality Date  . Cesarean section    . Lithotripsy     No family history on file. Social History  Substance Use Topics  . Smoking status: Current Every Day Smoker -- 0.50 packs/day    Types: Cigarettes  . Smokeless tobacco: None  . Alcohol Use: Yes     Comment: every other weekend   OB History    Gravida Para Term Preterm AB TAB SAB Ectopic Multiple Living   Review of Systems  Allergies  Review of patient's allergies indicates no known allergies.  Home Medications   Prior to Admission medications   Medication Sig Start Date End Date Taking? Authorizing Provider  ibuprofen (ADVIL,MOTRIN) 600 MG tablet Take 1 tablet (600 mg total) by mouth every 6 (six) hours as needed. 12/20/14  Yes Duane Lope, NP  amoxicillin-clavulanate (AUGMENTIN) 875-125 MG tablet Take 1 tablet by mouth every 12 (twelve) hours. 05/12/15   [name], MD  dicyclomine (BENTYL) 20 MG tablet Take 20 mg by mouth 4 (four) times daily. 12/10/14   Historical Provider, MD  naproxen (NAPROSYN) 500 MG tablet Take 1 tablet (500 mg  total) by mouth 2 (two) times daily with a meal. 05/12/15   [name], MD  oxyCODONE-acetaminophen (PERCOCET/ROXICET) 5-325 MG tablet Take 1 tablet by mouth every 8 (eight) hours as needed. 05/12/15   [name], MD  tamsulosin (FLOMAX) 0.4 MG CAPS capsule Take 1 capsule (0.4 mg total) by mouth daily after breakfast. 12/20/14   Duane Lope, NP  traMADol (ULTRAM) 50 MG tablet Take 1 tablet (50 mg total) by mouth every 6 (six) hours as needed for severe pain. 12/20/14   Duane Lope, NP   Meds Ordered and Administered this Visit   Medications  ketorolac (TORADOL) injection 60 mg (not administered)    BP 127/81 mmHg  Pulse 91  Temp(Src) 98.1 F (36.7 C) (Oral)  Resp 16  SpO2 92% No data found.   Physical Exam  Constitutional: She appears well-developed and well-nourished. No distress.  HENT:  Right Ear: External ear normal.  Left Ear: External ear normal.  Mouth/Throat: Oropharynx is clear and moist. No oropharyngeal exudate.  Left upper molar eroded out; mild surrounding gingival erythema without purulence. Clear oropharynx. Able to open/close jaw fully without limitation.   No frontal or maxillary sinus tenderness.   Eyes: Conjunctivae and EOM are normal. Pupils are equal, round, and reactive to light. Right eye exhibits no  discharge. Left eye exhibits no discharge.  Neck: Normal range of motion. Neck supple. No thyromegaly present.  Cardiovascular: Normal rate, regular rhythm and normal heart sounds.   Pulmonary/Chest: Effort normal and breath sounds normal. No respiratory distress.  Lymphadenopathy:    She has no cervical adenopathy.  Skin: She is not diaphoretic.    ED Course  Procedures (including critical care time)  Labs Review Labs Reviewed - No data to display  Imaging Review No results found.   Visual Acuity Review  Right Eye Distance:   Left Eye Distance:   Bilateral Distance:    Right Eye Near:   Left Eye Near:    Bilateral Near:          MDM   1. Dental infection    Patient with odontologic infection, L upper molar. Augmentin, pain control.  She has appt with dentist already made. [facility] if unable to tolerate po, if pain not relieved with prescribed medications. Tramadol IM today.  Patient is amenorrheic, due to Depo Provera, she reports that she is not pregnant.   Paula ComptonJames Justise Ehmann, MD    Barbaraann BarthelJames O Crislyn Willbanks, MD 05/12/15 [phone]

## 2015-07-01 ENCOUNTER — Emergency Department (HOSPITAL_COMMUNITY)
Admission: EM | Admit: 2015-07-01 | Discharge: 2015-07-01 | Disposition: A | Discharge status: Home / Self Care | Payer: Medicaid Other | Attending: Emergency Medicine | Admitting: Emergency Medicine

## 2015-07-01 ENCOUNTER — Encounter (HOSPITAL_COMMUNITY): Payer: Self-pay | Admitting: Emergency Medicine

## 2015-07-01 DIAGNOSIS — Y92410 Unspecified street and highway as the place of occurrence of the external cause: Secondary | ICD-10-CM | POA: Diagnosis not present

## 2015-07-01 DIAGNOSIS — Z79899 Other long term (current) drug therapy: Secondary | ICD-10-CM | POA: Insufficient documentation

## 2015-07-01 DIAGNOSIS — Y999 Unspecified external cause status: Secondary | ICD-10-CM | POA: Insufficient documentation

## 2015-07-01 DIAGNOSIS — M542 Cervicalgia: Secondary | ICD-10-CM | POA: Insufficient documentation

## 2015-07-01 DIAGNOSIS — Y939 Activity, unspecified: Secondary | ICD-10-CM | POA: Insufficient documentation

## 2015-07-01 DIAGNOSIS — Z791 Long term (current) use of non-steroidal anti-inflammatories (NSAID): Secondary | ICD-10-CM | POA: Insufficient documentation

## 2015-07-01 DIAGNOSIS — M545 Low back pain: Secondary | ICD-10-CM | POA: Insufficient documentation

## 2015-07-01 DIAGNOSIS — F329 Major depressive disorder, single episode, unspecified: Secondary | ICD-10-CM | POA: Insufficient documentation

## 2015-07-01 DIAGNOSIS — F17210 Nicotine dependence, cigarettes, uncomplicated: Secondary | ICD-10-CM | POA: Insufficient documentation

## 2015-07-01 MED ORDER — NAPROXEN 500 MG PO TABS: 500.0000 mg | ORAL_TABLET | Freq: Two times a day (BID) | ORAL | Status: DC

## 2015-07-01 MED ORDER — KETOROLAC TROMETHAMINE 30 MG/ML IJ SOLN
30.0000 mg | Freq: Once | INTRAMUSCULAR | Status: AC
  Administered 2015-07-01: 30 mg via INTRAMUSCULAR
  Filled 2015-07-01 (×2): qty 1
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
## 2015-08-14 ENCOUNTER — Emergency Department (HOSPITAL_COMMUNITY)
Admission: EM | Admit: 2015-08-14 | Discharge: 2015-08-14 | Disposition: A | Discharge status: Home / Self Care | Payer: Medicaid Other | Attending: Emergency Medicine | Admitting: Emergency Medicine

## 2015-08-14 ENCOUNTER — Emergency Department (HOSPITAL_COMMUNITY): Payer: Medicaid Other

## 2015-08-14 ENCOUNTER — Encounter (HOSPITAL_COMMUNITY): Payer: Self-pay | Admitting: Emergency Medicine

## 2015-08-14 DIAGNOSIS — Y939 Activity, unspecified: Secondary | ICD-10-CM | POA: Diagnosis not present

## 2015-08-14 DIAGNOSIS — Y999 Unspecified external cause status: Secondary | ICD-10-CM | POA: Diagnosis not present

## 2015-08-14 DIAGNOSIS — F17210 Nicotine dependence, cigarettes, uncomplicated: Secondary | ICD-10-CM | POA: Insufficient documentation

## 2015-08-14 DIAGNOSIS — Y929 Unspecified place or not applicable: Secondary | ICD-10-CM | POA: Insufficient documentation

## 2015-08-14 DIAGNOSIS — R51 Headache: Secondary | ICD-10-CM | POA: Insufficient documentation

## 2015-08-14 DIAGNOSIS — X58XXXA Exposure to other specified factors, initial encounter: Secondary | ICD-10-CM | POA: Insufficient documentation

## 2015-08-14 DIAGNOSIS — K047 Periapical abscess without sinus: Secondary | ICD-10-CM | POA: Insufficient documentation

## 2015-08-14 DIAGNOSIS — S161XXA Strain of muscle, fascia and tendon at neck level, initial encounter: Secondary | ICD-10-CM | POA: Diagnosis not present

## 2015-08-14 DIAGNOSIS — S199XXA Unspecified injury of neck, initial encounter: Secondary | ICD-10-CM | POA: Diagnosis present

## 2015-08-14 MED ORDER — DIPHENHYDRAMINE HCL 25 MG PO CAPS
25.0000 mg | ORAL_CAPSULE | Freq: Once | ORAL | Status: AC
  Administered 2015-08-14: 25 mg via ORAL
  Filled 2015-08-14: qty 1

## 2015-08-14 MED ORDER — DIAZEPAM 5 MG PO TABS: 5.0000 mg | ORAL_TABLET | Freq: Two times a day (BID) | ORAL | 0 refills | Status: DC | PRN

## 2015-08-14 MED ORDER — KETOROLAC TROMETHAMINE 60 MG/2ML IM SOLN
60.0000 mg | Freq: Once | INTRAMUSCULAR | Status: AC
  Administered 2015-08-14: 60 mg via INTRAMUSCULAR
  Filled 2015-08-14: qty 2

## 2015-08-14 MED ORDER — CLINDAMYCIN HCL 150 MG PO CAPS
300.0000 mg | ORAL_CAPSULE | Freq: Three times a day (TID) | ORAL | 0 refills | Status: AC
Start: 2015-08-14 — End: 2015-08-21

## 2015-08-14 MED ORDER — PROCHLORPERAZINE MALEATE 10 MG PO TABS
10.0000 mg | ORAL_TABLET | Freq: Once | ORAL | Status: AC
  Administered 2015-08-14: 10 mg via ORAL
  Filled 2015-08-14: qty 1

## 2015-08-14 MED ORDER — DIAZEPAM 5 MG/ML IJ SOLN
5.0000 mg | Freq: Once | INTRAMUSCULAR | Status: AC
  Administered 2015-08-14: 5 mg via INTRAMUSCULAR
  Filled 2015-08-14: qty 2

## 2015-08-14 NOTE — ED Provider Notes (Signed)
[facility]-EMERGENCY DEPT Provider Note   CSN: [id number] Arrival date & time: 08/14/15  5621  First Provider Contact:  None       History   Chief Complaint Chief Complaint  Patient presents with  . Mass  . Other    Headache    HPI Sue Clayton is a 30 y.o. female.  HPI   1 week of severe headaches, throbbing, started slowly, was coming and going now going down neck too.  Pain in middle of neck, feel like numbness Hx of cellulitis of the neck and feels similar No other focal numbness or weakness Lots of stress   Past Medical History:  Diagnosis Date  . Anxiety   . Depression   . Gall stones   . Kidney stones   . Mental disorder     Patient Active Problem List   Diagnosis Date Noted  . Smoker 01/04/2014  . Pregnancy 01/04/2014  . Anxiety state, unspecified 08/19/2013  . Vaginal candidiasis 08/19/2013  . Cellulitis, neck 08/17/2013    Past Surgical History:  Procedure Laterality Date  . CESAREAN SECTION    . LITHOTRIPSY      OB History    Gravida Para Term Preterm AB Living   8 4 4   3 4    SAB TAB Ectopic Multiple Live Births     3             Home Medications    Prior to Admission medications   Medication Sig Start Date End Date Taking? Authorizing Provider  ibuprofen (ADVIL,MOTRIN) 200 MG tablet Take 200 mg by mouth every 6 (six) hours as needed for mild pain.   Yes Historical Provider, MD  clindamycin (CLEOCIN) 150 MG capsule Take 2 capsules (300 mg total) by mouth 3 (three) times daily. 08/14/15 08/21/15  Alvira Monday, MD  diazepam (VALIUM) 5 MG tablet Take 1 tablet (5 mg total) by mouth every 12 (twelve) hours as needed for anxiety or muscle spasms. 08/14/15   Alvira Monday, MD  ibuprofen (ADVIL,MOTRIN) 600 MG tablet Take 1 tablet (600 mg total) by mouth every 6 (six) hours as needed. Patient not taking: Reported on 08/14/2015 12/20/14   Duane Lope, NP  tamsulosin (FLOMAX) 0.4 MG CAPS capsule Take 1 capsule (0.4 mg total) by mouth  daily after breakfast. Patient not taking: Reported on 08/14/2015 12/20/14   Duane Lope, NP    Family History No family history on file.  Social History Social History  Substance Use Topics  . Smoking status: Current Every Day Smoker    Packs/day: 0.50    Types: Cigarettes  . Smokeless tobacco: Not on file  . Alcohol use Yes     Comment: every other weekend     Allergies   Review of patient's allergies indicates no known allergies.   Review of Systems Review of Systems  Constitutional: Positive for appetite change. Negative for fever.  HENT: Positive for dental problem. Negative for trouble swallowing.   Respiratory: Negative for shortness of breath.   Cardiovascular: Negative for chest pain.  Gastrointestinal: Negative for nausea and vomiting.  Musculoskeletal: Positive for neck pain.  Neurological: Positive for numbness (over area of left neck) and headaches. Negative for facial asymmetry, speech difficulty and weakness.  All other systems reviewed and are negative.    Physical Exam Updated Vital Signs BP 130/89   Pulse (!) 54   Temp 97.9 F (36.6 C) (Oral)   Resp 14   Ht 5\' 7"  (1.702 m)  Wt 180 lb 1 oz (81.7 kg)   SpO2 100%   BMI 28.20 kg/m   Physical Exam  Constitutional: She is oriented to person, place, and time. She appears well-developed and well-nourished. No distress.  Crying, anxious  HENT:  Head: Normocephalic and atraumatic.  Left premolar with pain, poor dentition, caries  Eyes: Conjunctivae and EOM are normal.  Neck: Normal range of motion. Tracheal tenderness (trapezius with spasm, tenderness left lateral neck down to shoulder) present. No neck rigidity. Decreased range of motion: reports pain with movement, but spontaneously moving neck flex/ext with full ROM.  Cardiovascular: Normal rate, regular rhythm, normal heart sounds and intact distal pulses.  Exam reveals no gallop and no friction rub.   No murmur heard. Pulmonary/Chest:  Effort normal and breath sounds normal. No respiratory distress. She has no wheezes. She has no rales.  Abdominal: Soft. She exhibits no distension. There is no tenderness. There is no guarding.  Musculoskeletal: She exhibits no edema or tenderness.  Neurological: She is alert and oriented to person, place, and time. She has normal strength. No sensory deficit. GCS eye subscore is 4. GCS verbal subscore is 5. GCS motor subscore is 6.  Skin: Skin is warm and dry. Ecchymosis (left forearm) noted. No rash noted. She is not diaphoretic. No erythema.  Nursing note and vitals reviewed.    ED Treatments / Results  Labs (all labs ordered are listed, but only abnormal results are displayed) Labs Reviewed - No data to display  EKG  EKG Interpretation None       Radiology Ct Head Wo Contrast  Result Date: 08/14/2015 CLINICAL DATA:  Acute onset of headache and neck pain. Initial encounter. EXAM: CT HEAD WITHOUT CONTRAST CT CERVICAL SPINE WITHOUT CONTRAST TECHNIQUE: Multidetector CT imaging of the head and cervical spine was performed following the standard protocol without intravenous contrast. Multiplanar CT image reconstructions of the cervical spine were also generated. COMPARISON:  CT of the neck performed 08/14/2013 FINDINGS: CT HEAD FINDINGS There is no evidence of acute infarction, mass lesion, or intra- or extra-axial hemorrhage on CT. The posterior fossa, including the cerebellum, brainstem and fourth ventricle, is within normal limits. The third and lateral ventricles, and basal ganglia are unremarkable in appearance. The cerebral hemispheres are symmetric in appearance, with normal gray-white differentiation. No mass effect or midline shift is seen. There is no evidence of fracture; visualized osseous structures are unremarkable in appearance. The visualized portions of the orbits are within normal limits. The paranasal sinuses and mastoid air cells are well-aerated. No significant soft tissue  abnormalities are seen. CT CERVICAL SPINE FINDINGS There is no evidence of fracture or subluxation. Vertebral bodies demonstrate normal height and alignment. Intervertebral disc spaces are preserved. Prevertebral soft tissues are within normal limits. The visualized neural foramina are grossly unremarkable. The thyroid gland is unremarkable in appearance. The visualized lung apices are clear. No significant soft tissue abnormalities are seen. IMPRESSION: 1. No evidence of traumatic intracranial injury or fracture. 2. No evidence of fracture or subluxation along the cervical spine. Electronically Signed   By: Roanna RaiderJeffery  Chang M.D.   On: 08/14/2015 06:20   Ct Cervical Spine Wo Contrast  Result Date: 08/14/2015 CLINICAL DATA:  Acute onset of headache and neck pain. Initial encounter. EXAM: CT HEAD WITHOUT CONTRAST CT CERVICAL SPINE WITHOUT CONTRAST TECHNIQUE: Multidetector CT imaging of the head and cervical spine was performed following the standard protocol without intravenous contrast. Multiplanar CT image reconstructions of the cervical spine were also generated. COMPARISON:  CT of the neck performed 08/14/2013 FINDINGS: CT HEAD FINDINGS There is no evidence of acute infarction, mass lesion, or intra- or extra-axial hemorrhage on CT. The posterior fossa, including the cerebellum, brainstem and fourth ventricle, is within normal limits. The third and lateral ventricles, and basal ganglia are unremarkable in appearance. The cerebral hemispheres are symmetric in appearance, with normal gray-white differentiation. No mass effect or midline shift is seen. There is no evidence of fracture; visualized osseous structures are unremarkable in appearance. The visualized portions of the orbits are within normal limits. The paranasal sinuses and mastoid air cells are well-aerated. No significant soft tissue abnormalities are seen. CT CERVICAL SPINE FINDINGS There is no evidence of fracture or subluxation. Vertebral bodies  demonstrate normal height and alignment. Intervertebral disc spaces are preserved. Prevertebral soft tissues are within normal limits. The visualized neural foramina are grossly unremarkable. The thyroid gland is unremarkable in appearance. The visualized lung apices are clear. No significant soft tissue abnormalities are seen. IMPRESSION: 1. No evidence of traumatic intracranial injury or fracture. 2. No evidence of fracture or subluxation along the cervical spine. Electronically Signed   By: Roanna Raider M.D.   On: 08/14/2015 06:20    Procedures Procedures (including critical care time)  Medications Ordered in ED Medications  ketorolac (TORADOL) injection 60 mg (60 mg Intramuscular Given 08/14/15 0554)  diazepam (VALIUM) injection 5 mg (5 mg Intramuscular Given 08/14/15 0558)  prochlorperazine (COMPAZINE) tablet 10 mg (10 mg Oral Given 08/14/15 0637)  diphenhydrAMINE (BENADRYL) capsule 25 mg (25 mg Oral Given 08/14/15 0559)     Initial Impression / Assessment and Plan / ED Course  I have reviewed the triage vital signs and the nursing notes.  Pertinent labs & imaging results that were available during my care of the patient were reviewed by me and considered in my medical decision making (see chart for details).  Clinical Course   30yo female with history of anxiety, depression cellulitis of the neck in 2015 presents with concern for neck pain and headache. Headache began slowly, doubt SAH. No fever to suggest meningitis. Hx not consistent with RPA or other deep neck space infection.  Patient with tenderness over trapezius and by clinical hx and exam feel most likely etiology of pain is muscular spasm.  While she has history of cellulitis of the neck, she has no signs of cellulitis, no fluctuance at this time.  Patient requesting CT scan despite understanding risks of radiation, and given severity of pain, (pt also with contusions on arm, reports stress at home) ordered CT head and neck to  screen for traumatic injuries or other abnormalities/signs of infection. CT returned negative.  Pt likely with cervical strain. Gave valium, toradol, compazine and benadryl in ED. Given rx for valium.  She also has dental pain. Pt previously on augmentin, will now rx clindamycin for possible periapical abscess.  Patient discharged in stable condition with understanding of reasons to return.   Final Clinical Impressions(s) / ED Diagnoses   Final diagnoses:  Cervical muscle strain, initial encounter  Dental infection    New Prescriptions Discharge Medication List as of 08/14/2015  6:44 AM    START taking these medications   Details  clindamycin (CLEOCIN) 150 MG capsule Take 2 capsules (300 mg total) by mouth 3 (three) times daily., Starting Fri 08/14/2015, Until Fri 08/21/2015, Print    diazepam (VALIUM) 5 MG tablet Take 1 tablet (5 mg total) by mouth every 12 (twelve) hours as needed for anxiety or muscle spasms.,  Starting Fri 08/14/2015, Print         Alvira Monday, MD 08/14/15 [phone]

## 2015-08-14 NOTE — ED Notes (Signed)
Patient transported to CT 

## 2015-08-14 NOTE — ED Triage Notes (Signed)
Pt. reports lump at posterior neck onset last year , headache this evening with no nausea or photophobia . Denies fever or chills.
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
## 2017-08-04 ENCOUNTER — Encounter (HOSPITAL_COMMUNITY): Payer: Self-pay | Admitting: *Deleted

## 2017-08-04 ENCOUNTER — Inpatient Hospital Stay (HOSPITAL_COMMUNITY)
Admission: AD | Admit: 2017-08-04 | Discharge: 2017-08-04 | Disposition: A | Discharge status: Home / Self Care | Payer: Medicaid Other | Source: Ambulatory Visit | Attending: Family Medicine | Admitting: Family Medicine

## 2017-08-04 DIAGNOSIS — Z87442 Personal history of urinary calculi: Secondary | ICD-10-CM | POA: Insufficient documentation

## 2017-08-04 DIAGNOSIS — F329 Major depressive disorder, single episode, unspecified: Secondary | ICD-10-CM | POA: Diagnosis not present

## 2017-08-04 DIAGNOSIS — Z791 Long term (current) use of non-steroidal anti-inflammatories (NSAID): Secondary | ICD-10-CM | POA: Diagnosis not present

## 2017-08-04 DIAGNOSIS — Z79899 Other long term (current) drug therapy: Secondary | ICD-10-CM | POA: Insufficient documentation

## 2017-08-04 DIAGNOSIS — E860 Dehydration: Secondary | ICD-10-CM | POA: Insufficient documentation

## 2017-08-04 DIAGNOSIS — R109 Unspecified abdominal pain: Secondary | ICD-10-CM | POA: Diagnosis not present

## 2017-08-04 DIAGNOSIS — R300 Dysuria: Secondary | ICD-10-CM | POA: Diagnosis not present

## 2017-08-04 DIAGNOSIS — F419 Anxiety disorder, unspecified: Secondary | ICD-10-CM | POA: Diagnosis not present

## 2017-08-04 DIAGNOSIS — F17210 Nicotine dependence, cigarettes, uncomplicated: Secondary | ICD-10-CM | POA: Insufficient documentation

## 2017-08-04 LAB — URINALYSIS, ROUTINE W REFLEX MICROSCOPIC
BILIRUBIN URINE: NEGATIVE
Glucose, UA: NEGATIVE mg/dL
HGB URINE DIPSTICK: NEGATIVE
Ketones, ur: 5 mg/dL — AB
Leukocytes, UA: NEGATIVE
Nitrite: NEGATIVE
PROTEIN: NEGATIVE mg/dL
Specific Gravity, Urine: 1.027 (ref 1.005–1.030)
pH: 5 (ref 5.0–8.0)

## 2017-08-04 LAB — WET PREP, GENITAL
Clue Cells Wet Prep HPF POC: NONE SEEN
Sperm: NONE SEEN
Trich, Wet Prep: NONE SEEN
Yeast Wet Prep HPF POC: NONE SEEN

## 2017-08-04 LAB — POCT PREGNANCY, URINE: PREG TEST UR: NEGATIVE

## 2017-08-04 NOTE — MAU Note (Signed)
Pt C/O dysuria & LLQ pain for the last 6 days.  Denies fever, vomiting or diarrhea.  No vaginal bleeding.

## 2017-08-04 NOTE — Discharge Instructions (Signed)

## 2017-08-04 NOTE — MAU Provider Note (Signed)
History     CSN: [id number]  Arrival date and time: 08/04/17 5621    Chief Complaint  Patient presents with  . Abdominal Pain  . Dysuria   32 y.o. non-pregnant female here with dysuria and urgency x1.5 weeks. Having some lower abd discomfort with urination. Urine has appeared dark. Thinks she has a UTI. Reports drinking a lot of water. Denies fevers, back pain, and hematuria. Denies vaginal discharge. No new sexual partner. Reports recent change in toilet tissue that had fragrance on it, has since stopped using it.  Past Medical History:  Diagnosis Date  . Anxiety   . Depression   . Gall stones   . Kidney stones   . Mental disorder     Past Surgical History:  Procedure Laterality Date  . CESAREAN SECTION    . LITHOTRIPSY      No family history on file.  Social History   Tobacco Use  . Smoking status: Current Every Day Smoker    Packs/day: 0.50    Types: Cigarettes  Substance Use Topics  . Alcohol use: Yes    Comment: every other weekend  . Drug use: No    Allergies: No Known Allergies  Medications Prior to Admission  Medication Sig Dispense Refill Last Dose  . diazepam (VALIUM) 5 MG tablet Take 1 tablet (5 mg total) by mouth every 12 (twelve) hours as needed for anxiety or muscle spasms. 10 tablet 0   . ibuprofen (ADVIL,MOTRIN) 200 MG tablet Take 200 mg by mouth every 6 (six) hours as needed for mild pain.   08/13/2015 at Unknown time  . ibuprofen (ADVIL,MOTRIN) 600 MG tablet Take 1 tablet (600 mg total) by mouth every 6 (six) hours as needed. (Patient not taking: Reported on 08/14/2015) 30 tablet 0 Completed Course at Unknown time  . tamsulosin (FLOMAX) 0.4 MG CAPS capsule Take 1 capsule (0.4 mg total) by mouth daily after breakfast. (Patient not taking: Reported on 08/14/2015) 14 capsule 0 Not Taking at Unknown time    Review of Systems  Constitutional: Negative for fever.  Gastrointestinal: Positive for abdominal pain. Negative for nausea and vomiting.   Genitourinary: Positive for dysuria and urgency. Negative for frequency and hematuria.  Musculoskeletal: Negative for back pain.   Physical Exam   Blood pressure (!) 135/93, pulse 67, temperature 97.7 F (36.5 C), temperature source Oral, resp. rate 18, height 5\' 7"  (1.702 m), weight 255 lb (115.7 kg), unknown if currently breastfeeding.  Physical Exam  Constitutional: She is oriented to person, place, and time. She appears well-developed and well-nourished. No distress.  HENT:  Head: Normocephalic and atraumatic.  Neck: Normal range of motion.  Cardiovascular: Normal rate.  Respiratory: Effort normal. No respiratory distress.  Musculoskeletal: Normal range of motion.  Neurological: She is alert and oriented to person, place, and time.  Skin: Skin is warm and dry.  Psychiatric: She has a normal mood and affect.   Results for orders placed or performed during the hospital encounter of 08/04/17 (from the past 24 hour(s))  Urinalysis, Routine w reflex microscopic     Status: Abnormal   Collection Time: 08/04/17  9:32 AM  Result Value Ref Range   Color, Urine YELLOW YELLOW   APPearance CLEAR CLEAR   Specific Gravity, Urine 1.027 1.005 - 1.030   pH 5.0 5.0 - 8.0   Glucose, UA NEGATIVE NEGATIVE mg/dL   Hgb urine dipstick NEGATIVE NEGATIVE   Bilirubin Urine NEGATIVE NEGATIVE   Ketones, ur 5 (A) NEGATIVE mg/dL  Protein, ur NEGATIVE NEGATIVE mg/dL   Nitrite NEGATIVE NEGATIVE   Leukocytes, UA NEGATIVE NEGATIVE  Pregnancy, urine POC     Status: None   Collection Time: 08/04/17  9:39 AM  Result Value Ref Range   Preg Test, Ur NEGATIVE NEGATIVE  Wet prep, genital     Status: Abnormal   Collection Time: 08/04/17 10:41 AM  Result Value Ref Range   Yeast Wet Prep HPF POC NONE SEEN NONE SEEN   Trich, Wet Prep NONE SEEN NONE SEEN   Clue Cells Wet Prep HPF POC NONE SEEN NONE SEEN   WBC, Wet Prep HPF POC FEW (A) NONE SEEN   Sperm NONE SEEN    MAU Course  Procedures  MDM Labs  ordered and reviewed. Dehydration noted, recommend increased water intake. No evidence of UTI or vaginal infection. Will send UC. Sx could be caused by dehydration and/or irritation from new product use. Stable for discharge home.   Assessment and Plan   1. Dysuria    Discharge home Follow up with PCP if sx persist Increase water intake  Allergies as of 08/04/2017   No Known Allergies     Medication List    STOP taking these medications   diazepam 5 MG tablet Commonly known as:  VALIUM   ibuprofen 200 MG tablet Commonly known as:  ADVIL,MOTRIN   ibuprofen 600 MG tablet Commonly known as:  ADVIL,MOTRIN   tamsulosin 0.4 MG Caps capsule Commonly known as:  Sandi MealyFLOMAX      Morgane Joerger, CNM 08/04/2017, 11:12 AM

## 2017-08-05 LAB — URINE CULTURE: Culture: NO GROWTH

## 2017-08-07 LAB — GC/CHLAMYDIA PROBE AMP (~~LOC~~) NOT AT ARMC
Chlamydia: NEGATIVE
Neisseria Gonorrhea: NEGATIVE

## 2017-10-12 ENCOUNTER — Encounter: Payer: Self-pay | Admitting: *Deleted

## 2017-11-02 ENCOUNTER — Encounter: Payer: Medicaid Other | Admitting: Family Medicine

## 2017-12-12 ENCOUNTER — Encounter: Payer: Medicaid Other | Admitting: Advanced Practice Midwife

## 2017-12-19 ENCOUNTER — Encounter (HOSPITAL_COMMUNITY): Payer: Self-pay

## 2017-12-19 ENCOUNTER — Inpatient Hospital Stay (HOSPITAL_COMMUNITY)
Admission: AD | Admit: 2017-12-19 | Discharge: 2017-12-19 | Disposition: A | Discharge status: Home / Self Care | Payer: Medicaid Other | Source: Intra-hospital | Attending: Obstetrics and Gynecology | Admitting: Obstetrics and Gynecology

## 2017-12-19 DIAGNOSIS — Z3202 Encounter for pregnancy test, result negative: Secondary | ICD-10-CM | POA: Insufficient documentation

## 2017-12-19 DIAGNOSIS — R1032 Left lower quadrant pain: Secondary | ICD-10-CM | POA: Diagnosis not present

## 2017-12-19 DIAGNOSIS — B9689 Other specified bacterial agents as the cause of diseases classified elsewhere: Secondary | ICD-10-CM | POA: Insufficient documentation

## 2017-12-19 DIAGNOSIS — F17210 Nicotine dependence, cigarettes, uncomplicated: Secondary | ICD-10-CM | POA: Insufficient documentation

## 2017-12-19 DIAGNOSIS — N760 Acute vaginitis: Secondary | ICD-10-CM | POA: Diagnosis not present

## 2017-12-19 LAB — URINALYSIS, ROUTINE W REFLEX MICROSCOPIC
Bilirubin Urine: NEGATIVE
GLUCOSE, UA: NEGATIVE mg/dL
HGB URINE DIPSTICK: NEGATIVE
KETONES UR: NEGATIVE mg/dL
LEUKOCYTES UA: NEGATIVE
Nitrite: NEGATIVE
PH: 6.5 (ref 5.0–8.0)
PROTEIN: NEGATIVE mg/dL
Specific Gravity, Urine: <1.005 — ABNORMAL LOW (ref 1.005–1.030)

## 2017-12-19 LAB — WET PREP, GENITAL
SPERM: NONE SEEN
TRICH WET PREP: NONE SEEN
Yeast Wet Prep HPF POC: NONE SEEN

## 2017-12-19 LAB — POCT PREGNANCY, URINE: Preg Test, Ur: NEGATIVE

## 2017-12-19 MED ORDER — KETOROLAC TROMETHAMINE 60 MG/2ML IM SOLN
60.0000 mg | Freq: Once | INTRAMUSCULAR | Status: AC
  Administered 2017-12-19: 60 mg via INTRAMUSCULAR
  Filled 2017-12-19: qty 2

## 2017-12-19 MED ORDER — METRONIDAZOLE 500 MG PO TABS: 500.0000 mg | ORAL_TABLET | Freq: Two times a day (BID) | ORAL | 0 refills | Status: DC

## 2017-12-19 NOTE — MAU Provider Note (Signed)
History     CSN: [id number]  Arrival date and time: 12/19/17 4098   First Provider Initiated Contact with Patient 12/19/17 [phone]      Chief Complaint  Patient presents with  . Abdominal Pain  . Vaginal Discharge   HPI  Sue Clayton is a 32 y.o. [id number] non pregnant female who presents with left lower abdominal pain. She states she has had they symptoms for two days and she rates the pain a 4/10. She has tried ibuprofen with some relief. She reports the pain is worse with urination. She also reports an abnormal vaginal discharge that started after she began douching again. Denies any bleeding.   OB History    Gravida  8   Para  4   Term  4   Preterm      AB  3   Living  4     SAB      TAB  3   Ectopic      Multiple      Live Births              Past Medical History:  Diagnosis Date  . Anxiety   . Depression   . Gall stones   . Kidney stones   . Mental disorder     Past Surgical History:  Procedure Laterality Date  . CESAREAN SECTION    . LITHOTRIPSY      History reviewed. No pertinent family history.  Social History   Tobacco Use  . Smoking status: Current Every Day Smoker    Packs/day: 0.50    Types: Cigarettes  . Smokeless tobacco: Never Used  Substance Use Topics  . Alcohol use: Yes    Comment: every other weekend  . Drug use: No    Allergies: No Known Allergies  No medications prior to admission.    Review of Systems  Constitutional: Negative.  Negative for fatigue and fever.  HENT: Negative.   Respiratory: Negative.  Negative for shortness of breath.   Cardiovascular: Negative.  Negative for chest pain.  Gastrointestinal: Positive for abdominal pain. Negative for constipation, diarrhea, nausea and vomiting.  Genitourinary: Positive for vaginal discharge. Negative for dysuria and vaginal bleeding.  Neurological: Negative.  Negative for dizziness and headaches.   Physical Exam   Blood pressure 129/85, pulse 89,  temperature 98.3 F (36.8 C), temperature source Oral, resp. rate 16, height 5\' 7"  (1.702 m), weight 107.5 kg, last menstrual period 11/20/2017, SpO2 100 %, unknown if currently breastfeeding.  Physical Exam  Nursing note and vitals reviewed. Constitutional: She is oriented to person, place, and time. She appears well-developed and well-nourished. No distress.  HENT:  Head: Normocephalic.  Eyes: Pupils are equal, round, and reactive to light.  Cardiovascular: Normal rate, regular rhythm and normal heart sounds.  Respiratory: Effort normal and breath sounds normal. No respiratory distress.  GI: Soft. Bowel sounds are normal. She exhibits no distension. There is no abdominal tenderness. There is no rebound and no guarding.  Genitourinary:    Vaginal discharge present.   Neurological: She is alert and oriented to person, place, and time.  Skin: Skin is warm and dry.  Psychiatric: She has a normal mood and affect. Her behavior is normal. Judgment and thought content normal.    MAU Course  Procedures Results for orders placed or performed during the hospital encounter of 12/19/17 (from the past 24 hour(s))  Urinalysis, Routine w reflex microscopic     Status: Abnormal   Collection Time: 12/19/17  8:57 AM  Result Value Ref Range   Color, Urine YELLOW YELLOW   APPearance CLEAR CLEAR   Specific Gravity, Urine <1.005 (L) 1.005 - 1.030   pH 6.5 5.0 - 8.0   Glucose, UA NEGATIVE NEGATIVE mg/dL   Hgb urine dipstick NEGATIVE NEGATIVE   Bilirubin Urine NEGATIVE NEGATIVE   Ketones, ur NEGATIVE NEGATIVE mg/dL   Protein, ur NEGATIVE NEGATIVE mg/dL   Nitrite NEGATIVE NEGATIVE   Leukocytes, UA NEGATIVE NEGATIVE  Pregnancy, urine POC     Status: None   Collection Time: 12/19/17  9:00 AM  Result Value Ref Range   Preg Test, Ur NEGATIVE NEGATIVE  Wet prep, genital     Status: Abnormal   Collection Time: 12/19/17  9:25 AM  Result Value Ref Range   Yeast Wet Prep HPF POC NONE SEEN NONE SEEN    Trich, Wet Prep NONE SEEN NONE SEEN   Clue Cells Wet Prep HPF POC PRESENT (A) NONE SEEN   WBC, Wet Prep HPF POC FEW (A) NONE SEEN   Sperm NONE SEEN    MDM UA, UPT Wet prep and gc/chlamydia Toradol IM  Assessment and Plan   1. Bacterial vaginosis   2. Left lower quadrant abdominal pain    -Discharge home in stable condition -Rx for metronidazole sent to patient's pharmacy. Alcohol precautions reviewed -Encouraged patient to stop douching and vaginal health precautions reviewed -Patient advised to follow-up with gyn of choice for routine needs -Patient may return to MAU as needed or if her condition were to change or worsen  Rolm BookbinderCaroline M Benton Tooker CNM 12/19/2017, 9:05 AM

## 2017-12-19 NOTE — Discharge Instructions (Signed)
In late 2019, the [facility] will be moving to the [place] campus. At that time, the MAU (Maternity Admissions Unit), where you are being seen today, will no longer take care of non-pregnant patients. We strongly encourage you to find a doctor's office before that time, so that you can be seen with any GYN concerns, like vaginal discharge, urinary tract infection, etc.. in a timely manner. ° °In order to make an office visit more convenient, the [facility] will be offering evening hours with same-day appointments, walk-in appointments and scheduled appointments available during this time. ° °[facility] Hours: °Monday - 8am - 7:30 pm with walk-in between 4pm- 7:30 pm °Tuesday - 8 am - 5 pm (starting 04/04/17 we will be open late and accepting walk-ins from 4pm - 7:30pm) °Wednesday - 8 am - 5 pm (starting 07/05/17 we will be open late and accepting walk-ins from 4pm - 7:30pm) °Thursday 8 am - 5 pm (starting 10/05/17 we will be open late and accepting walk-ins from 4pm - 7:30pm) °Friday 8 am - 5 pm ° °For an appointment please call the [facility] at [phone] ° °For urgent needs, [facility] is also available for management of urgent GYN complaints such as vaginal discharge or urinary tract infections. ° ° ° ° ° ° °Bacterial Vaginosis °Bacterial vaginosis is a vaginal infection that occurs when the normal balance of bacteria in the vagina is disrupted. It results from an overgrowth of certain bacteria. This is the most common vaginal infection among women ages 15-44. °Because bacterial vaginosis increases your risk for STIs (sexually transmitted infections), getting treated can help reduce your risk for chlamydia, gonorrhea, herpes, and HIV (human immunodeficiency virus). Treatment is also important for preventing complications in pregnant women, because this condition can cause an early  (premature) delivery. °What are the causes? °This condition is caused by an increase in harmful bacteria that are normally present in small amounts in the vagina. However, the reason that the condition develops is not fully understood. °What increases the risk? °The following factors may make you more likely to develop this condition: °· Having a new sexual partner or multiple sexual partners. °· Having unprotected sex. °· Douching. °· Having an intrauterine device (IUD). °· Smoking. °· Drug and alcohol abuse. °· Taking certain antibiotic medicines. °· Being pregnant. ° °You cannot get bacterial vaginosis from toilet seats, bedding, swimming pools, or contact with objects around you. °What are the signs or symptoms? °Symptoms of this condition include: °· Grey or white vaginal discharge. The discharge can also be watery or foamy. °· A fish-like odor with discharge, especially after sexual intercourse or during menstruation. °· Itching in and around the vagina. °· Burning or pain with urination. ° °Some women with bacterial vaginosis have no signs or symptoms. °How is this diagnosed? °This condition is diagnosed based on: °· Your medical history. °· A physical exam of the vagina. °· Testing a sample of vaginal fluid under a microscope to look for a large amount of bad bacteria or abnormal cells. Your health care provider may use a cotton swab or a small wooden spatula to collect the sample. ° °How is this treated? °This condition is treated with antibiotics. These may be given as a pill, a vaginal cream, or a medicine that is put into the vagina (suppository). If the condition comes back after treatment, a second round of antibiotics may   be needed. °Follow these instructions at home: °Medicines °· Take over-the-counter and prescription medicines only as told by your health care provider. °· Take or use your antibiotic as told by your health care provider. Do not stop taking or using the antibiotic even if you start  to feel better. °General instructions °· If you have a female sexual partner, tell her that you have a vaginal infection. She should see her health care provider and be treated if she has symptoms. If you have a female sexual partner, he does not need treatment. °· During treatment: °? Avoid sexual activity until you finish treatment. °? Do not douche. °? Avoid alcohol as directed by your health care provider. °? Avoid breastfeeding as directed by your health care provider. °· Drink enough water and fluids to keep your urine clear or pale yellow. °· Keep the area around your vagina and rectum clean. °? Wash the area daily with warm water. °? Wipe yourself from front to back after using the toilet. °· Keep all follow-up visits as told by your health care provider. This is important. °How is this prevented? °· Do not douche. °· Wash the outside of your vagina with warm water only. °· Use protection when having sex. This includes latex condoms and dental dams. °· Limit how many sexual partners you have. To help prevent bacterial vaginosis, it is best to have sex with just one partner (monogamous). °· Make sure you and your sexual partner are tested for STIs. °· Wear cotton or cotton-lined underwear. °· Avoid wearing tight pants and pantyhose, especially during summer. °· Limit the amount of alcohol that you drink. °· Do not use any products that contain nicotine or tobacco, such as cigarettes and e-cigarettes. If you need help quitting, ask your health care provider. °· Do not use illegal drugs. °Where to find more information: °· Centers for Disease Control and Prevention: www.cdc.gov/std °· American Sexual Health Association (ASHA): www.ashastd.org °· U.S. Department of Health and Human Services, Office on Women's Health: www.womenshealth.gov/ or https://www.womenshealth.gov/a-z-topics/bacterial-vaginosis °Contact a health care provider if: °· Your symptoms do not improve, even after treatment. °· You have more  discharge or pain when urinating. °· You have a fever. °· You have pain in your abdomen. °· You have pain during sex. °· You have vaginal bleeding between periods. °Summary °· Bacterial vaginosis is a vaginal infection that occurs when the normal balance of bacteria in the vagina is disrupted. °· Because bacterial vaginosis increases your risk for STIs (sexually transmitted infections), getting treated can help reduce your risk for chlamydia, gonorrhea, herpes, and HIV (human immunodeficiency virus). Treatment is also important for preventing complications in pregnant women, because the condition can cause an early (premature) delivery. °· This condition is treated with antibiotic medicines. These may be given as a pill, a vaginal cream, or a medicine that is put into the vagina (suppository). °This information is not intended to replace advice given to you by your health care provider. Make sure you discuss any questions you have with your health care provider. °Document Released: 12/20/2004 Document Revised: 04/25/2016 Document Reviewed: 09/05/2015 °Elsevier Interactive Patient Education © 2018 [employer] ° °

## 2017-12-19 NOTE — MAU Note (Signed)
Pt reports left lower abd pain that radiates to her back and vaginal discharge. Symptoms x 3 days.

## 2017-12-20 LAB — GC/CHLAMYDIA PROBE AMP (~~LOC~~) NOT AT ARMC
CHLAMYDIA, DNA PROBE: NEGATIVE
NEISSERIA GONORRHEA: NEGATIVE

## 2018-05-11 ENCOUNTER — Inpatient Hospital Stay (HOSPITAL_COMMUNITY): Payer: Medicaid Other

## 2018-05-11 ENCOUNTER — Encounter (HOSPITAL_COMMUNITY): Payer: Self-pay

## 2018-05-11 ENCOUNTER — Other Ambulatory Visit: Payer: Self-pay

## 2018-05-11 ENCOUNTER — Inpatient Hospital Stay (HOSPITAL_COMMUNITY)
Admission: AD | Admit: 2018-05-11 | Discharge: 2018-05-11 | Disposition: A | Discharge status: Home / Self Care | Payer: Medicaid Other | Attending: Family Medicine | Admitting: Family Medicine

## 2018-05-11 DIAGNOSIS — F17210 Nicotine dependence, cigarettes, uncomplicated: Secondary | ICD-10-CM | POA: Diagnosis not present

## 2018-05-11 DIAGNOSIS — O3680X0 Pregnancy with inconclusive fetal viability, not applicable or unspecified: Secondary | ICD-10-CM | POA: Diagnosis not present

## 2018-05-11 DIAGNOSIS — R109 Unspecified abdominal pain: Secondary | ICD-10-CM

## 2018-05-11 DIAGNOSIS — Z87442 Personal history of urinary calculi: Secondary | ICD-10-CM | POA: Insufficient documentation

## 2018-05-11 DIAGNOSIS — O99331 Smoking (tobacco) complicating pregnancy, first trimester: Secondary | ICD-10-CM | POA: Diagnosis not present

## 2018-05-11 DIAGNOSIS — O26899 Other specified pregnancy related conditions, unspecified trimester: Secondary | ICD-10-CM | POA: Diagnosis not present

## 2018-05-11 DIAGNOSIS — Z3A01 Less than 8 weeks gestation of pregnancy: Secondary | ICD-10-CM | POA: Insufficient documentation

## 2018-05-11 DIAGNOSIS — O26891 Other specified pregnancy related conditions, first trimester: Secondary | ICD-10-CM | POA: Diagnosis not present

## 2018-05-11 DIAGNOSIS — O23591 Infection of other part of genital tract in pregnancy, first trimester: Secondary | ICD-10-CM | POA: Diagnosis not present

## 2018-05-11 DIAGNOSIS — B9689 Other specified bacterial agents as the cause of diseases classified elsewhere: Secondary | ICD-10-CM | POA: Diagnosis not present

## 2018-05-11 DIAGNOSIS — R110 Nausea: Secondary | ICD-10-CM | POA: Diagnosis not present

## 2018-05-11 LAB — URINALYSIS, ROUTINE W REFLEX MICROSCOPIC
Bilirubin Urine: NEGATIVE
Glucose, UA: NEGATIVE mg/dL
Hgb urine dipstick: NEGATIVE
Ketones, ur: NEGATIVE mg/dL
Leukocytes,Ua: NEGATIVE
Nitrite: NEGATIVE
Protein, ur: NEGATIVE mg/dL
Specific Gravity, Urine: 1.013 (ref 1.005–1.030)
pH: 6 (ref 5.0–8.0)

## 2018-05-11 LAB — CBC
HCT: 37.5 % (ref 36.0–46.0)
Hemoglobin: 12.2 g/dL (ref 12.0–15.0)
MCH: 27.4 pg (ref 26.0–34.0)
MCHC: 32.5 g/dL (ref 30.0–36.0)
MCV: 84.3 fL (ref 80.0–100.0)
Platelets: 287 10*3/uL (ref 150–400)
RBC: 4.45 MIL/uL (ref 3.87–5.11)
RDW: 15.1 % (ref 11.5–15.5)
WBC: 9.6 10*3/uL (ref 4.0–10.5)
nRBC: 0 % (ref 0.0–0.2)

## 2018-05-11 LAB — WET PREP, GENITAL
Sperm: NONE SEEN
Trich, Wet Prep: NONE SEEN
Yeast Wet Prep HPF POC: NONE SEEN

## 2018-05-11 LAB — POCT PREGNANCY, URINE: Preg Test, Ur: POSITIVE — AB

## 2018-05-11 LAB — HCG, QUANTITATIVE, PREGNANCY: hCG, Beta Chain, Quant, S: 405 m[IU]/mL — ABNORMAL HIGH (ref <5)

## 2018-05-11 MED ORDER — ACETAMINOPHEN 500 MG PO TABS
1000.0000 mg | ORAL_TABLET | Freq: Once | ORAL | Status: AC
  Administered 2018-05-11: 1000 mg via ORAL
  Filled 2018-05-11: qty 2

## 2018-05-11 MED ORDER — METRONIDAZOLE 500 MG PO TABS: 500.0000 mg | ORAL_TABLET | Freq: Two times a day (BID) | ORAL | 0 refills | Status: DC

## 2018-05-11 MED ORDER — METOCLOPRAMIDE HCL 10 MG PO TABS
10.0000 mg | ORAL_TABLET | Freq: Once | ORAL | Status: AC
  Administered 2018-05-11: 10 mg via ORAL
  Filled 2018-05-11: qty 1

## 2018-05-11 NOTE — Discharge Instructions (Signed)

## 2018-05-11 NOTE — MAU Note (Signed)
My period was late and took upt yesterday and postive. Having pain in LLQ that goes thru to back for 7days and getting worse. No spotting or d/c. Hurts if I hold my urine.

## 2018-05-11 NOTE — MAU Provider Note (Signed)
History     CSN: [id number]  Arrival date and time: 05/11/18 2423   First Provider Initiated Contact with Patient 05/11/18 2105      Chief Complaint  Patient presents with  . Abdominal Pain   Sue Clayton is a 33 y.o. [id number] at [redacted]w[redacted]d by Unsure LMP who presents for Abdominal Pain.  She states the pain is located on her left side and is a "sharp numbing sensation."  She reports the pain radiates from the front to the back.  Patient reports the pain has been present for the last 6-7 days and is constant.  She has not attempted any interventions and reports it is worse with "holding my urine."  She denies sexual activity in the last 7 days and history of ectopic pregnancy. She also reports some nausea and "spitting" and requests medication.  She states she has had kidney stones in the past and the pain is similar.      OB History    Gravida  9   Para  4   Term  4   Preterm      AB  3   Living  4     SAB      TAB  3   Ectopic      Multiple      Live Births              Past Medical History:  Diagnosis Date  . Anxiety   . Depression   . Gall stones   . Kidney stones   . Mental disorder     Past Surgical History:  Procedure Laterality Date  . CESAREAN SECTION    . LITHOTRIPSY      No family history on file.  Social History   Tobacco Use  . Smoking status: Current Every Day Smoker    Packs/day: 0.50    Types: Cigarettes  . Smokeless tobacco: Never Used  Substance Use Topics  . Alcohol use: Yes    Comment: every other weekend  . Drug use: No    Allergies: No Known Allergies  Medications Prior to Admission  Medication Sig Dispense Refill Last Dose  . metroNIDAZOLE (FLAGYL) 500 MG tablet Take 1 tablet (500 mg total) by mouth 2 (two) times daily. 14 tablet 0     Review of Systems  Constitutional: Negative for chills and fever.  Respiratory: Negative for cough and shortness of breath.   Gastrointestinal: Positive for abdominal pain and  nausea. Negative for constipation, diarrhea and vomiting.  Genitourinary: Negative for dysuria, vaginal bleeding and vaginal discharge.  Musculoskeletal: Positive for back pain.  Neurological: Negative for dizziness, light-headedness and headaches.   Physical Exam   Blood pressure 121/71, pulse 62, temperature 98.4 F (36.9 C), resp. rate 18, height 5\' 7"  (1.702 m), weight 105.7 kg, last menstrual period 04/04/2018, unknown if currently breastfeeding.  Physical Exam  Constitutional: She is oriented to person, place, and time. She appears well-developed and well-nourished.  HENT:  Head: Normocephalic and atraumatic.  Eyes: Conjunctivae are normal.  Neck: Normal range of motion.  Cardiovascular: Normal rate, regular rhythm and normal heart sounds.  Respiratory: Effort normal and breath sounds normal.  GI: Soft. Bowel sounds are normal. There is abdominal tenderness in the left lower quadrant. There is no rigidity, no guarding and no CVA tenderness.  Genitourinary: There is no tenderness on the right labia. There is no tenderness on the left labia.    Genitourinary Comments: Blind Swab for collection of Wet  prep and GC/CT. No apparent discharge noted at introitus.    Musculoskeletal: Normal range of motion.  Neurological: She is alert and oriented to person, place, and time.  Skin: Skin is warm and dry.  Psychiatric: She has a normal mood and affect. Her behavior is normal.    MAU Course  Procedures Results for orders placed or performed during the hospital encounter of 05/11/18 (from the past 24 hour(s))  Urinalysis, Routine w reflex microscopic     Status: Abnormal   Collection Time: 05/11/18  7:18 PM  Result Value Ref Range   Color, Urine STRAW (A) YELLOW   APPearance CLEAR CLEAR   Specific Gravity, Urine 1.013 1.005 - 1.030   pH 6.0 5.0 - 8.0   Glucose, UA NEGATIVE NEGATIVE mg/dL   Hgb urine dipstick NEGATIVE NEGATIVE   Bilirubin Urine NEGATIVE NEGATIVE   Ketones, ur  NEGATIVE NEGATIVE mg/dL   Protein, ur NEGATIVE NEGATIVE mg/dL   Nitrite NEGATIVE NEGATIVE   Leukocytes,Ua NEGATIVE NEGATIVE  Pregnancy, urine POC     Status: Abnormal   Collection Time: 05/11/18  7:24 PM  Result Value Ref Range   Preg Test, Ur POSITIVE (A) NEGATIVE  Wet prep, genital     Status: Abnormal   Collection Time: 05/11/18  9:19 PM  Result Value Ref Range   Yeast Wet Prep HPF POC NONE SEEN NONE SEEN   Trich, Wet Prep NONE SEEN NONE SEEN   Clue Cells Wet Prep HPF POC PRESENT (A) NONE SEEN   WBC, Wet Prep HPF POC FEW (A) NONE SEEN   Sperm NONE SEEN   hCG, quantitative, pregnancy     Status: Abnormal   Collection Time: 05/11/18  9:31 PM  Result Value Ref Range   hCG, Beta Chain, Quant, S 405 (H) <5 mIU/mL  CBC     Status: None   Collection Time: 05/11/18  9:31 PM  Result Value Ref Range   WBC 9.6 4.0 - 10.5 K/uL   RBC 4.45 3.87 - 5.11 MIL/uL   Hemoglobin 12.2 12.0 - 15.0 g/dL   HCT 69.637.5 29.536.0 - 28.446.0 %   MCV 84.3 80.0 - 100.0 fL   MCH 27.4 26.0 - 34.0 pg   MCHC 32.5 30.0 - 36.0 g/dL   RDW 13.215.1 44.011.5 - 10.215.5 %   Platelets 287 150 - 400 K/uL   nRBC 0.0 0.0 - 0.2 %    MDM Physical Exam Labs: UA, Wet Prep, GC/CT, CBC, hCG US: Renal and Transvaginal Antiemetic Pain Medication Assessment and Plan  33 year old [id number] at 5.2 weeks Left Flank Pain Nausea  -Exam findings discussed -Educated on possibility of ectopic pregnancy and need for further evaluation. -Labs collected and pending -Reglan 10mg  PO now -Tylenol XR now -Will send for US and await results  Follow Up (10:44 PM) Bacterial Vaginosis Pregnancy of Unknown Location  -US results return without apparent IUG, but quant also low at 405. -Wet prep returns significant for clue. -Results discussed with patient and discussed need for follow up and treatment. -[facility]-patient unavailable and scheduled for Tuesday Morning at 0900 -Ectopic precautions  given -Rx for Flagyl sent to pharmacy on file.  -No questions or concerns. -Encouraged to call or return to MAU if symptoms worsen or with the onset of new symptoms. -Discharged to home in stable condition   Cherre RobinsJessica L Boy Delamater MSN, CNM 05/11/2018, 9:05 PM

## 2018-05-11 NOTE — MAU Provider Note (Signed)
PT had kidney stones before and says this pain is similar

## 2018-05-14 LAB — GC/CHLAMYDIA PROBE AMP (~~LOC~~) NOT AT ARMC
Chlamydia: NEGATIVE
Neisseria Gonorrhea: NEGATIVE

## 2018-05-15 ENCOUNTER — Telehealth: Payer: Self-pay | Admitting: General Practice

## 2018-05-15 ENCOUNTER — Ambulatory Visit (INDEPENDENT_AMBULATORY_CARE_PROVIDER_SITE_OTHER): Payer: Medicaid Other | Admitting: General Practice

## 2018-05-15 ENCOUNTER — Other Ambulatory Visit: Payer: Self-pay

## 2018-05-15 DIAGNOSIS — O3680X0 Pregnancy with inconclusive fetal viability, not applicable or unspecified: Secondary | ICD-10-CM

## 2018-05-15 LAB — BETA HCG QUANT (REF LAB): hCG Quant: 1319 m[IU]/mL

## 2018-05-15 NOTE — Progress Notes (Signed)
Patient presents to office today for stat bhcg. Patient reports pain from MAU has decreased and is minimal now- denies bleeding. Discussed with patient we are monitoring your bhcg levels today and asked for a working phone number to contact her in approximately 2 hours with her results/updated plan of care. Patient verbalized understanding and provided number [phone]. Told patient I will contact her there with results. Patient verbalized understanding & had no questions.    Reviewed results with Luna Kitchens who finds appropriate rise in bhcg levels, patient should have follow up ultrasound in 2 weeks. Scheduled 5/26 @ 10am. Will call patient with results.  Chase Caller RN BSN 05/15/18

## 2018-05-15 NOTE — Telephone Encounter (Signed)
Called patient and informed her of bhcg results and ultrasound appt. Also reviewed ectopic precautions with patient. Patient verbalized understanding to all & had no questions.

## 2018-05-16 NOTE — Progress Notes (Signed)
Chart reviewed for nurse visit. Agree with plan of care.   Marylene Land, CNM 05/16/2018 12:41 PM

## 2018-05-29 ENCOUNTER — Ambulatory Visit (HOSPITAL_COMMUNITY): Payer: Medicaid Other

## 2018-05-30 ENCOUNTER — Ambulatory Visit (HOSPITAL_COMMUNITY)
Admission: RE | Admit: 2018-05-30 | Discharge: 2018-05-30 | Disposition: A | Discharge status: Home / Self Care | Payer: Medicaid Other | Source: Ambulatory Visit | Attending: Student | Admitting: Student

## 2018-05-30 ENCOUNTER — Other Ambulatory Visit: Payer: Self-pay

## 2018-05-30 DIAGNOSIS — O3680X0 Pregnancy with inconclusive fetal viability, not applicable or unspecified: Secondary | ICD-10-CM | POA: Insufficient documentation

## 2018-06-06 ENCOUNTER — Telehealth: Payer: Self-pay

## 2018-06-06 NOTE — Telephone Encounter (Signed)
Pt called to get [name] results advised shows [redacted]w[redacted]d as of 05/30/2018.EDD: 01/17/2019, advised that some one will be calling her for a New Ob Intake, virtually and then a New Ob Dr. Visit will be scheduled. Pt verbalized understanding.

## 2018-06-07 ENCOUNTER — Telehealth: Payer: Self-pay

## 2018-06-07 NOTE — Telephone Encounter (Signed)
Pt called the front office with concern of her dating that she was informed.  I called pt and informed her that her her EDD is 01/17/2019 which makes her exactly 8w today.  Pt stated understanding with no further questions.

## 2018-06-12 ENCOUNTER — Telehealth: Payer: Self-pay

## 2018-06-12 ENCOUNTER — Other Ambulatory Visit: Payer: Self-pay

## 2018-06-12 ENCOUNTER — Telehealth: Payer: Medicaid Other | Admitting: *Deleted

## 2018-06-12 DIAGNOSIS — Z3490 Encounter for supervision of normal pregnancy, unspecified, unspecified trimester: Secondary | ICD-10-CM

## 2018-06-12 NOTE — Telephone Encounter (Signed)
Called the patient once more. The patient stated she does not remember her username or password. The number to mychart help desk was given and also rescheduled the patient's appointment as she stated she no longer wanted to try to complete it.

## 2018-06-12 NOTE — Progress Notes (Signed)
Called pt @ 1315 to begin MyChart video visit as scheduled. Pt stated she was having trouble with MyChart - she had not downloaded the App. I attempted to assist pt however was unsuccessful. Another staff member called pt in order to assist her with the process. Pt continued to have difficulty, became frustrated and requested to reschedule the appointment.

## 2018-06-12 NOTE — Telephone Encounter (Signed)
The patient called in for assistance to access mychart. As we were going over instructions the call disconnected. Called back and left a voicemail.

## 2018-06-18 ENCOUNTER — Telehealth: Payer: Self-pay | Admitting: Lactation Services

## 2018-06-18 ENCOUNTER — Telehealth: Payer: Self-pay | Admitting: Family Medicine

## 2018-06-18 NOTE — Telephone Encounter (Signed)
Spoke with patient about her appointment on 6/16 @ 2:15. Patient was instructed that it will be a mychart visit and she verbalized that she has the app downloaded and knows how to access the appointment. Patient was not screened because it is a virtual appointment.

## 2018-06-18 NOTE — Telephone Encounter (Signed)
Pt called and left message on nurse phone line that she would like to know her EDD. Called pt back at 1:55 PM  Informed her based on her last [name] her EDD is 01/17/2019. She is concerned as the dates does not add up based on her LMP of 04/04/18. Discussed she could have ovulated a little later than normal. Pt indicated that she is aware she had sex on 4/15 and the following week. Pt voiced understanding once I informed her of [name] results and that she is currently 9 weeks and 4 days based on last [name].

## 2018-06-19 ENCOUNTER — Telehealth: Payer: Medicaid Other | Admitting: *Deleted

## 2018-06-19 ENCOUNTER — Encounter: Payer: Self-pay | Admitting: Family Medicine

## 2018-06-19 ENCOUNTER — Telehealth: Payer: Self-pay

## 2018-06-19 ENCOUNTER — Other Ambulatory Visit: Payer: Self-pay

## 2018-06-19 ENCOUNTER — Telehealth: Payer: Self-pay | Admitting: Family Medicine

## 2018-06-19 ENCOUNTER — Encounter: Payer: Self-pay | Admitting: *Deleted

## 2018-06-19 DIAGNOSIS — Z3490 Encounter for supervision of normal pregnancy, unspecified, unspecified trimester: Secondary | ICD-10-CM

## 2018-06-19 NOTE — Telephone Encounter (Signed)
Called the patient to inform of upcoming visit. No voicemail setup.

## 2018-06-19 NOTE — Telephone Encounter (Signed)
Attempted to call patient to get her rescheduled for her missed new ob intake appointment. No answer, unable to leave a message due to the mailbox being full. No show letter mailed

## 2018-06-19 NOTE — Progress Notes (Signed)
[place] pt in order to begin MyChart video visit as scheduled @ 1415. Pt did not answer and unable to leave message as her VM is full. MyChart message sent to pt.   1418 Second call to pt and no answer. Unable to leave message.  1430 Third call to pt with no answer and unable to leave message. Pt's appointment will be rescheduled and she will be notified of the details.

## 2018-06-26 ENCOUNTER — Encounter: Payer: Medicaid Other | Admitting: Medical

## 2018-07-04 ENCOUNTER — Encounter: Payer: Medicaid Other | Admitting: Obstetrics and Gynecology

## 2018-07-10 ENCOUNTER — Encounter: Payer: Self-pay | Admitting: *Deleted

## 2019-05-27 ENCOUNTER — Ambulatory Visit
Admission: EM | Admit: 2019-05-27 | Discharge: 2019-05-27 | Disposition: A | Discharge status: Home / Self Care | Payer: Medicaid Other | Attending: Physician Assistant | Admitting: Physician Assistant

## 2019-05-27 DIAGNOSIS — N760 Acute vaginitis: Secondary | ICD-10-CM | POA: Insufficient documentation

## 2019-05-27 MED ORDER — METRONIDAZOLE 500 MG PO TABS: 500.0000 mg | ORAL_TABLET | Freq: Two times a day (BID) | ORAL | 0 refills | Status: DC

## 2019-05-27 MED ORDER — FLUCONAZOLE 150 MG PO TABS: 150.0000 mg | ORAL_TABLET | Freq: Every day | ORAL | 0 refills | Status: DC

## 2019-05-27 NOTE — Discharge Instructions (Signed)
You were treated empirically for BV. Start flagyl. Diflucan if needed. Cytology sent, you will be contacted with any positive results that requires further treatment. Refrain from sexual activity and alcohol use for the next 7 days. Monitor for any worsening of symptoms, fever, abdominal pain, nausea, vomiting, to follow up for reevaluation.

## 2019-05-27 NOTE — ED Provider Notes (Signed)
[name]    CSN: [id number] Arrival date & time: 05/27/19  1154      History   Chief Complaint Chief Complaint  Patient presents with  . Vaginal Itching    HPI Sue Clayton is a 34 y.o. female.   34 year old female comes in for 3 day history of vaginal irritation, white discharge with odor.  Denies spotting. Denies urinary symptoms such as frequency, dysuria, hematuria. Denies fever, chills, body aches. Denies abdominal pain, nausea, vomiting. LMP 05/04/2019. Sexually active 2 female partners, no condom use. Not on any birth control. Recently started using new hygiene product     Past Medical History:  Diagnosis Date  . Anxiety   . Depression   . Gall stones   . Kidney stones   . Mental disorder     Patient Active Problem List   Diagnosis Date Noted  . Smoker 01/04/2014  . Pregnancy 01/04/2014  . Anxiety state, unspecified 08/19/2013  . Vaginal candidiasis 08/19/2013  . Cellulitis, neck 08/17/2013    Past Surgical History:  Procedure Laterality Date  . CESAREAN SECTION    . LITHOTRIPSY      OB History    Gravida  9   Para  4   Term  4   Preterm      AB  3   Living  4     SAB      TAB  3   Ectopic      Multiple      Live Births               Home Medications    Prior to Admission medications   Medication Sig Start Date End Date Taking? Authorizing Provider  fluconazole (DIFLUCAN) 150 MG tablet Take 1 tablet (150 mg total) by mouth daily. Take second dose 72 hours later if symptoms still persists. 05/27/19   Cathie Hoops, Astou Lada V, PA-C  metroNIDAZOLE (FLAGYL) 500 MG tablet Take 1 tablet (500 mg total) by mouth 2 (two) times daily. 05/27/19   Belinda Fisher, PA-C    Family History History reviewed. No pertinent family history.  Social History Social History   Tobacco Use  . Smoking status: Current Every Day Smoker    Packs/day: 0.50    Types: Cigarettes  . Smokeless tobacco: Never Used  Substance Use Topics  . Alcohol use:  Yes    Comment: every other weekend  . Drug use: No     Allergies   Patient has no known allergies.   Review of Systems Review of Systems  Reason unable to perform ROS: See HPI as above.     Physical Exam Triage Vital Signs ED Triage Vitals [05/27/19 1200]  Enc Vitals Group     BP 125/85     Pulse Rate 94     Resp 18     Temp 98.2 F (36.8 C)     Temp Source Oral     SpO2 99 %     Weight      Height      Head Circumference      Peak Flow      Pain Score 8     Pain Loc      Pain Edu?      Excl. in GC?    No data found.  Updated Vital Signs BP 125/85 (BP Location: Left Arm)   Pulse 94   Temp 98.2 F (36.8 C) (Oral)   Resp 18   LMP  05/04/2019   SpO2 99%   Breastfeeding No   Physical Exam Constitutional:      General: She is not in acute distress.    Appearance: Normal appearance. She is well-developed. She is not toxic-appearing or diaphoretic.  HENT:     Head: Normocephalic and atraumatic.  Eyes:     Conjunctiva/sclera: Conjunctivae normal.     Pupils: Pupils are equal, round, and reactive to light.  Pulmonary:     Effort: Pulmonary effort is normal. No respiratory distress.     Comments: Speaking in full sentences without difficulty Abdominal:     General: Bowel sounds are normal.     Palpations: Abdomen is soft.     Tenderness: There is no abdominal tenderness. There is no guarding or rebound.  Musculoskeletal:     Cervical back: Normal range of motion and neck supple.  Skin:    General: Skin is warm and dry.  Neurological:     Mental Status: She is alert and oriented to person, place, and time.      UC Treatments / Results  Labs (all labs ordered are listed, but only abnormal results are displayed) Labs Reviewed  CERVICOVAGINAL ANCILLARY ONLY    EKG   Radiology No results found.  Procedures Procedures (including critical care time)  Medications Ordered in UC Medications - No data to display  Initial Impression / Assessment  and Plan / UC Course  I have reviewed the triage vital signs and the nursing notes.  Pertinent labs & imaging results that were available during my care of the patient were reviewed by me and considered in my medical decision making (see chart for details).    Patient was treated empirically for BV. Flagyl as directed. Diflucan as needed. Cytology sent, patient will be contacted with any positive results that require additional treatment. Patient to refrain from sexual activity for the next 7 days. Return precautions given.   Final Clinical Impressions(s) / UC Diagnoses   Final diagnoses:  Acute vaginitis    ED Prescriptions    Medication Sig Dispense Auth. Provider   metroNIDAZOLE (FLAGYL) 500 MG tablet Take 1 tablet (500 mg total) by mouth 2 (two) times daily. 14 tablet Nkechi Linehan V, PA-C   fluconazole (DIFLUCAN) 150 MG tablet Take 1 tablet (150 mg total) by mouth daily. Take second dose 72 hours later if symptoms still persists. 2 tablet Ok Edwards, PA-C     PDMP not reviewed this encounter.   Ok Edwards, PA-C 05/27/19 1225

## 2019-05-27 NOTE — ED Triage Notes (Signed)
Pt c/o vaginal irritation, white discharge with odor x3 days. States has had un protected intercourse.

## 2019-05-28 LAB — CERVICOVAGINAL ANCILLARY ONLY
Chlamydia: NEGATIVE
Comment: NEGATIVE
Comment: NEGATIVE
Comment: NORMAL
Neisseria Gonorrhea: NEGATIVE
Trichomonas: NEGATIVE

## 2019-11-06 ENCOUNTER — Other Ambulatory Visit: Payer: Self-pay

## 2019-11-06 ENCOUNTER — Ambulatory Visit
Admission: EM | Admit: 2019-11-06 | Discharge: 2019-11-06 | Disposition: A | Discharge status: Home / Self Care | Payer: Medicaid Other | Attending: Physician Assistant | Admitting: Physician Assistant

## 2019-11-06 ENCOUNTER — Encounter: Payer: Self-pay | Admitting: Emergency Medicine

## 2019-11-06 DIAGNOSIS — N760 Acute vaginitis: Secondary | ICD-10-CM | POA: Diagnosis not present

## 2019-11-06 LAB — POCT URINALYSIS DIP (MANUAL ENTRY)
Blood, UA: NEGATIVE
Glucose, UA: NEGATIVE mg/dL
Leukocytes, UA: NEGATIVE
Nitrite, UA: NEGATIVE
Protein Ur, POC: NEGATIVE mg/dL
Spec Grav, UA: 1.02 (ref 1.010–1.025)
Urobilinogen, UA: 0.2 E.U./dL
pH, UA: 6.5 (ref 5.0–8.0)

## 2019-11-06 LAB — POCT URINE PREGNANCY: Preg Test, Ur: NEGATIVE

## 2019-11-06 MED ORDER — METRONIDAZOLE 500 MG PO TABS: 500.0000 mg | ORAL_TABLET | Freq: Two times a day (BID) | ORAL | 0 refills | Status: DC

## 2019-11-06 MED ORDER — FLUCONAZOLE 150 MG PO TABS: 150.0000 mg | ORAL_TABLET | Freq: Every day | ORAL | 0 refills | Status: DC

## 2019-11-06 NOTE — ED Triage Notes (Signed)
Patient c/o vaginal discharge x 3 days. Patient c/o dysuria.   Patient states discharge is "white and clumpy" . Patient denies smell or itching w/ discharge.  Patient states she has pain w/ urination.   Patient c/o fatigue x 2 weeks.  Patient wants some blood testing. Patient states she started a new diet for weight loss.   History of iron deficiency per patient statement.

## 2019-11-06 NOTE — Discharge Instructions (Signed)
Urine negative for infection. You were treated empirically for BV and yeast. Flagyl and diflucan as directed. Cytology sent, you will be contacted with any positive results that requires further treatment. Refrain from sexual activity and alcohol use for the next 7 days. Monitor for any worsening of symptoms, fever, abdominal pain, nausea, vomiting, to follow up for reevaluation.

## 2019-11-06 NOTE — ED Provider Notes (Signed)
[name]    CSN: [id number] Arrival date & time: 11/06/19  1310      History   Chief Complaint Chief Complaint  Patient presents with  . Dysuria  . Vaginal Discharge  . Fatigue    HPI Sue Clayton is a 34 y.o. female.   34 year old female comes in for 3 day history of vaginal discharge, dysuria. States discharge is "white and clumpy". Denies vaginal itching, odor. Has increased frequency in urination, but has increased fluid intake due to new diet. Low abdominal pain that has been going on for months, intermittent, worse with urination. Denies association with oral intake.  Denies fever, chills, body aches. Denies nausea, vomiting. LMP 10/14/2019. Sexually active 1 female partner, no condom use. Patient states symptoms consistent with what she experiences with BV, usually triggered by semen.      Past Medical History:  Diagnosis Date  . Anxiety   . Depression   . Gall stones   . Kidney stones   . Mental disorder     Patient Active Problem List   Diagnosis Date Noted  . Smoker 01/04/2014  . Pregnancy 01/04/2014  . Anxiety state, unspecified 08/19/2013  . Vaginal candidiasis 08/19/2013  . Cellulitis, neck 08/17/2013    Past Surgical History:  Procedure Laterality Date  . CESAREAN SECTION    . LITHOTRIPSY      OB History    Gravida  9   Para  4   Term  4   Preterm      AB  3   Living  4     SAB      TAB  3   Ectopic      Multiple      Live Births               Home Medications    Prior to Admission medications   Medication Sig Start Date End Date Taking? Authorizing Provider  fluconazole (DIFLUCAN) 150 MG tablet Take 1 tablet (150 mg total) by mouth daily. Take second dose 72 hours later if symptoms still persists. 11/06/19   Cathie Hoops, Dannelle Rhymes V, PA-C  metroNIDAZOLE (FLAGYL) 500 MG tablet Take 1 tablet (500 mg total) by mouth 2 (two) times daily. 11/06/19   Belinda Fisher, PA-C    Family History History reviewed. No pertinent  family history.  Social History Social History   Tobacco Use  . Smoking status: Current Every Day Smoker    Packs/day: 0.50    Types: Cigarettes  . Smokeless tobacco: Never Used  Substance Use Topics  . Alcohol use: Yes    Comment: every other weekend  . Drug use: No     Allergies   Patient has no known allergies.   Review of Systems Review of Systems  Reason unable to perform ROS: See HPI as above.     Physical Exam Triage Vital Signs ED Triage Vitals  Enc Vitals Group     BP 11/06/19 1338 120/80     Pulse Rate 11/06/19 1338 79     Resp 11/06/19 1338 13     Temp 11/06/19 1338 98.1 F (36.7 C)     Temp Source 11/06/19 1338 Oral     SpO2 11/06/19 1338 98 %     Weight --      Height --      Head Circumference --      Peak Flow --      Pain Score 11/06/19 1335 0  Pain Loc --      Pain Edu? --      Excl. in GC? --    No data found.  Updated Vital Signs BP 120/80 (BP Location: Right Arm)   Pulse 79   Temp 98.1 F (36.7 C) (Oral)   Resp 13   LMP 10/14/2019   SpO2 98%   Physical Exam Constitutional:      General: She is not in acute distress.    Appearance: She is well-developed. She is not ill-appearing, toxic-appearing or diaphoretic.  HENT:     Head: Normocephalic and atraumatic.  Eyes:     Conjunctiva/sclera: Conjunctivae normal.     Pupils: Pupils are equal, round, and reactive to light.  Cardiovascular:     Rate and Rhythm: Normal rate and regular rhythm.  Pulmonary:     Effort: Pulmonary effort is normal. No respiratory distress.     Comments: LCTAB Abdominal:     General: Bowel sounds are normal.     Palpations: Abdomen is soft.     Tenderness: There is no abdominal tenderness. There is no right CVA tenderness, left CVA tenderness, guarding or rebound.  Musculoskeletal:     Cervical back: Normal range of motion and neck supple.  Skin:    General: Skin is warm and dry.  Neurological:     Mental Status: She is alert and oriented to  person, place, and time.  Psychiatric:        Behavior: Behavior normal.        Judgment: Judgment normal.      UC Treatments / Results  Labs (all labs ordered are listed, but only abnormal results are displayed) Labs Reviewed  POCT URINALYSIS DIP (MANUAL ENTRY) - Abnormal; Notable for the following components:      Result Value   Bilirubin, UA small (*)    Ketones, POC UA moderate (40) (*)    All other components within normal limits  POCT URINE PREGNANCY  CERVICOVAGINAL ANCILLARY ONLY    EKG   Radiology No results found.  Procedures Procedures (including critical care time)  Medications Ordered in UC Medications - No data to display  Initial Impression / Assessment and Plan / UC Course  I have reviewed the triage vital signs and the nursing notes.  Pertinent labs & imaging results that were available during my care of the patient were reviewed by me and considered in my medical decision making (see chart for details).    Urine with ketones, negative for glucose, leuks, nitrite. Recently started phentermine and on low carb diet, eating approx once a day. Discussed can expect some ketones to urine, and to follow up with weight loss clinic. Otherwise will cover for BV, yeast with flagyl/diflucan. Cytology sent. Push fluids. Return precautions given.  Final Clinical Impressions(s) / UC Diagnoses   Final diagnoses:  Acute vaginitis    ED Prescriptions    Medication Sig Dispense Auth. Provider   metroNIDAZOLE (FLAGYL) 500 MG tablet Take 1 tablet (500 mg total) by mouth 2 (two) times daily. 14 tablet Versia Mignogna V, PA-C   fluconazole (DIFLUCAN) 150 MG tablet Take 1 tablet (150 mg total) by mouth daily. Take second dose 72 hours later if symptoms still persists. 2 tablet Belinda Fisher, PA-C     PDMP not reviewed this encounter.   Belinda Fisher, PA-C 11/06/19 1408

## 2019-11-08 LAB — CERVICOVAGINAL ANCILLARY ONLY
Chlamydia: NEGATIVE
Comment: NEGATIVE
Comment: NEGATIVE
Comment: NORMAL
Neisseria Gonorrhea: NEGATIVE
Trichomonas: NEGATIVE

## 2019-11-12 ENCOUNTER — Ambulatory Visit
Admission: EM | Admit: 2019-11-12 | Discharge: 2019-11-12 | Disposition: A | Discharge status: Home / Self Care | Payer: Medicaid Other | Attending: Family Medicine | Admitting: Family Medicine

## 2019-11-12 DIAGNOSIS — J209 Acute bronchitis, unspecified: Secondary | ICD-10-CM | POA: Insufficient documentation

## 2019-11-12 DIAGNOSIS — M940 Chondrocostal junction syndrome [Tietze]: Secondary | ICD-10-CM | POA: Diagnosis present

## 2019-11-12 MED ORDER — AZITHROMYCIN 250 MG PO TABS: 250.0000 mg | ORAL_TABLET | Freq: Every day | ORAL | 0 refills | Status: DC

## 2019-11-12 NOTE — Discharge Instructions (Signed)
Take ibuprofen for costochondritis (chest pain) Drink extra fluids Discontinue smoking Continue Mucinex

## 2019-11-12 NOTE — ED Triage Notes (Signed)
Pt c/o center chest pain after having a cough for 2wk. States feels tight and has mucus that can't get it up. No distress.

## 2019-11-12 NOTE — ED Provider Notes (Signed)
[name]    CSN: [id number] Arrival date & time: 11/12/19  1026      History   Chief Complaint Chief Complaint  Patient presents with  . Chest Pain    HPI Sue Clayton is a 34 y.o. female.   Patient presents with chest pain located on either side of the sternum.  She has been coughing for about a week.  Still feels like there is some mucus in her phlegm in her chest that she hears some rattling wheeze.  She is a smoker but has stopped smoking about 2 days ago.  There have been no fever or chills.  HPI  Past Medical History:  Diagnosis Date  . Anxiety   . Depression   . Gall stones   . Kidney stones   . Mental disorder     Patient Active Problem List   Diagnosis Date Noted  . Smoker 01/04/2014  . Pregnancy 01/04/2014  . Anxiety state, unspecified 08/19/2013  . Vaginal candidiasis 08/19/2013  . Cellulitis, neck 08/17/2013    Past Surgical History:  Procedure Laterality Date  . CESAREAN SECTION    . LITHOTRIPSY      OB History    Gravida  9   Para  4   Term  4   Preterm      AB  3   Living  4     SAB      TAB  3   Ectopic      Multiple      Live Births               Home Medications    Prior to Admission medications   Not on File    Family History No family history on file.  Social History Social History   Tobacco Use  . Smoking status: Current Every Day Smoker    Packs/day: 0.50    Types: Cigarettes  . Smokeless tobacco: Never Used  Substance Use Topics  . Alcohol use: Yes    Comment: every other weekend  . Drug use: No     Allergies   Patient has no known allergies.   Review of Systems Review of Systems  HENT: Positive for congestion.   Respiratory: Positive for cough.   Cardiovascular: Positive for chest pain.  All other systems reviewed and are negative.    Physical Exam Triage Vital Signs ED Triage Vitals [11/12/19 1042]  Enc Vitals Group     BP 134/88     Pulse Rate 78      Resp 18     Temp 98.4 F (36.9 C)     Temp Source Oral     SpO2 98 %     Weight      Height      Head Circumference      Peak Flow      Pain Score 7     Pain Loc      Pain Edu?      Excl. in GC?    No data found.  Updated Vital Signs BP 134/88 (BP Location: Left Arm)   Pulse 78   Temp 98.4 F (36.9 C) (Oral)   Resp 18   LMP 10/14/2019   SpO2 98%   Visual Acuity Right Eye Distance:   Left Eye Distance:   Bilateral Distance:    Right Eye Near:   Left Eye Near:    Bilateral Near:     Physical Exam Vitals and nursing note  reviewed.  Constitutional:      Appearance: She is well-developed. She is obese.  HENT:     Head: Normocephalic.  Cardiovascular:     Rate and Rhythm: Normal rate and regular rhythm.     Heart sounds: Normal heart sounds.  Pulmonary:     Effort: Pulmonary effort is normal.     Breath sounds: Examination of the right-middle field reveals rhonchi. Examination of the left-middle field reveals rhonchi. Rhonchi present.  Chest:     Chest wall: Tenderness present.     Comments: Tenderness present on either side of the sternum and costochondral cartilage area Musculoskeletal:     Cervical back: Normal range of motion.  Neurological:     General: No focal deficit present.     Mental Status: She is alert.      UC Treatments / Results  Labs (all labs ordered are listed, but only abnormal results are displayed) Labs Reviewed - No data to display  EKG   Radiology No results found.  Procedures Procedures (including critical care time)  Medications Ordered in UC Medications - No data to display  Initial Impression / Assessment and Plan / UC Course  I have reviewed the triage vital signs and the nursing notes.  Pertinent labs & imaging results that were available during my care of the patient were reviewed by me and considered in my medical decision making (see chart for details).    Bronchitis and secondary costochondritis Final  Clinical Impressions(s) / UC Diagnoses   Final diagnoses:  None   Discharge Instructions   None    ED Prescriptions    None     PDMP not reviewed this encounter.   Frederica Kuster, MD 11/12/19 1057

## 2019-12-12 ENCOUNTER — Encounter (HOSPITAL_COMMUNITY): Payer: Self-pay | Admitting: *Deleted

## 2019-12-12 ENCOUNTER — Other Ambulatory Visit: Payer: Self-pay

## 2019-12-12 ENCOUNTER — Emergency Department (HOSPITAL_COMMUNITY)
Admission: EM | Admit: 2019-12-12 | Discharge: 2019-12-12 | Disposition: A | Discharge status: Home / Self Care | Payer: Medicaid Other | Attending: Emergency Medicine | Admitting: Emergency Medicine

## 2019-12-12 ENCOUNTER — Emergency Department (HOSPITAL_COMMUNITY): Payer: Medicaid Other

## 2019-12-12 DIAGNOSIS — R002 Palpitations: Secondary | ICD-10-CM | POA: Diagnosis not present

## 2019-12-12 DIAGNOSIS — R0789 Other chest pain: Secondary | ICD-10-CM | POA: Diagnosis not present

## 2019-12-12 DIAGNOSIS — F17210 Nicotine dependence, cigarettes, uncomplicated: Secondary | ICD-10-CM | POA: Diagnosis not present

## 2019-12-12 DIAGNOSIS — R519 Headache, unspecified: Secondary | ICD-10-CM | POA: Insufficient documentation

## 2019-12-12 DIAGNOSIS — M542 Cervicalgia: Secondary | ICD-10-CM | POA: Diagnosis not present

## 2019-12-12 DIAGNOSIS — G44209 Tension-type headache, unspecified, not intractable: Secondary | ICD-10-CM

## 2019-12-12 LAB — I-STAT BETA HCG BLOOD, ED (MC, WL, AP ONLY): I-stat hCG, quantitative: <5 m[IU]/mL (ref <5)

## 2019-12-12 MED ORDER — KETOROLAC TROMETHAMINE 30 MG/ML IJ SOLN
30.0000 mg | Freq: Once | INTRAMUSCULAR | Status: AC
  Administered 2019-12-12: 30 mg via INTRAVENOUS
  Filled 2019-12-12: qty 1

## 2019-12-12 MED ORDER — METHOCARBAMOL 1000 MG/10ML IJ SOLN
500.0000 mg | Freq: Once | INTRAVENOUS | Status: AC
  Administered 2019-12-12: 500 mg via INTRAVENOUS
  Filled 2019-12-12: qty 5

## 2019-12-12 MED ORDER — METHOCARBAMOL 1000 MG/10ML IJ SOLN: 500.0000 mg | Freq: Once | INTRAMUSCULAR | Status: DC

## 2019-12-12 MED ORDER — IBUPROFEN 800 MG PO TABS: 800.0000 mg | ORAL_TABLET | Freq: Three times a day (TID) | ORAL | 0 refills | Status: DC | PRN

## 2019-12-12 MED ORDER — METHOCARBAMOL 500 MG PO TABS: 500.0000 mg | ORAL_TABLET | Freq: Three times a day (TID) | ORAL | 0 refills | Status: DC | PRN

## 2019-12-12 NOTE — ED Provider Notes (Signed)
TIME SEEN: 5:50 AM  CHIEF COMPLAINT: Headache, neck pain, ear pain, chest pain  HPI: Patient is a 34 year old female with history of anxiety, depression who presents to the emergency department with complaints of several weeks of posterior throbbing headache that radiates into her neck.  States she is having anterior and posterior neck pain.  She is also having bilateral ear pain.  States her chest is hurting and hurts with palpation.  No shortness of breath, fevers, cough, difficulty swallowing or pain with swallowing, nausea, vomiting, diarrhea, numbness, tingling or weakness.  Able to ambulate.  No head injury.  Not on blood thinners.  Has not taken any medication at home for pain.  ROS: See HPI Constitutional: no fever  Eyes: no drainage  ENT: no runny nose   Cardiovascular:  no chest pain  Resp: no SOB  GI: no vomiting GU: no dysuria Integumentary: no rash  Allergy: no hives  Musculoskeletal: no leg swelling  Neurological: no slurred speech ROS otherwise negative  PAST MEDICAL HISTORY/PAST SURGICAL HISTORY:  Past Medical History:  Diagnosis Date  . Anxiety   . Depression   . Gall stones   . Kidney stones   . Mental disorder     MEDICATIONS:  Prior to Admission medications   Medication Sig Start Date End Date Taking? Authorizing Provider  azithromycin (ZITHROMAX) 250 MG tablet Take 1 tablet (250 mg total) by mouth daily. Take first 2 tablets together, then 1 every day until finished. 11/12/19   Frederica Kuster, MD    ALLERGIES:  No Known Allergies  SOCIAL HISTORY:  Social History   Tobacco Use  . Smoking status: Current Every Day Smoker    Packs/day: 0.50    Types: Cigarettes  . Smokeless tobacco: Never Used  Substance Use Topics  . Alcohol use: Yes    Comment: every other weekend    FAMILY HISTORY: No family history on file.  EXAM: BP 120/87   Pulse 71   Temp 98.4 F (36.9 C) (Oral)   Resp 16   Ht 5\' 7"  (1.702 m)   Wt 108 kg   LMP 11/16/2019    SpO2 97%   BMI 37.28 kg/m  CONSTITUTIONAL: Alert and oriented and responds appropriately to questions. Well-appearing; well-nourished HEAD: Normocephalic, atraumatic EYES: Conjunctivae clear, pupils appear equal, EOM appear intact ENT: normal nose; moist mucous membranes; No pharyngeal erythema or petechiae, no tonsillar hypertrophy or exudate, no uvular deviation, no unilateral swelling, no trismus or drooling, no muffled voice, normal phonation, no stridor, no dental caries present, no drainable dental abscess noted, no Ludwig's angina, tongue sits flat in the bottom of the mouth, no angioedema, no facial erythema or warmth, no facial swelling; no pain with movement of the neck, no cervical LAD.  TMs are clear bilaterally without erythema, purulence, bulging, perforation, effusion.  No cerumen impaction or sign of foreign body in the external auditory canal. No inflammation, erythema or drainage from the external auditory canal. No signs of mastoiditis. No pain with manipulation of the pinna bilaterally. NECK: Supple, normal ROM, tender to palpation over her trapezius muscles and scalenes bilaterally; no midline spinal tenderness, step-off or deformity; no cervical lymphadenopathy CARD: RRR; S1 and S2 appreciated; no murmurs, no clicks, no rubs, no gallops CHEST:  Chest wall is tender to palpation across the anterior chest wall.  No crepitus, ecchymosis, erythema, warmth, rash or other lesions present.   RESP: Normal chest excursion without splinting or tachypnea; breath sounds clear and equal bilaterally; no wheezes, no  rhonchi, no rales, no hypoxia or respiratory distress, speaking full sentences ABD/GI: Normal bowel sounds; non-distended; soft, non-tender, no rebound, no guarding, no peritoneal signs, no hepatosplenomegaly BACK:  The back appears normal EXT: Normal ROM in all joints; no deformity noted, no edema; no cyanosis SKIN: Normal color for age and race; warm; no rash on exposed  skin NEURO: Moves all extremities equally, normal sensation diffusely, cranial nerves II through XII intact, normal speech PSYCH: The patient's mood and manner are appropriate.   MEDICAL DECISION MAKING: Patient here with what I suspect is a tension headache.  She has tenderness throughout her neck muscles into the back of her head.  No focal neurologic deficits.  No meningismus.  No fever.  Doubt stroke, intracranial hemorrhage, meningitis, encephalitis.  Also complaining of neck pain but no difficulty swallowing or pain with swallowing.  No signs of pharyngitis, tonsillitis, PTA, RPA on exam.  Also complaining of chest pain but this seems very atypical, musculoskeletal in nature.  Will obtain EKG but at this time I do not feel she needs further cardiac work-up.  Doubt pneumothorax will obtain chest x-ray.  No infectious symptoms.  She has equal breath sounds bilaterally and her sats are 97%.  Doubt ACS, PE, dissection.  Will treat symptoms with Toradol, Robaxin and reassess.  ED PROGRESS: EKG shows no ischemic abnormality other than septal Q waves but again low suspicion for ACS.  No old for comparison.  Chest x-ray shows no abnormality.  Headache improving with Toradol and Robaxin.  Will discharge with ibuprofen, Robaxin.  At this time, I do not feel there is any life-threatening condition present. I have reviewed, interpreted and discussed all results (EKG, imaging, lab, urine as appropriate) and exam findings with patient/family. I have reviewed nursing notes and appropriate previous records.  I feel the patient is safe to be discharged home without further emergent workup and can continue workup as an outpatient as needed. Discussed usual and customary return precautions. Patient/family verbalize understanding and are comfortable with this plan.  Outpatient follow-up has been provided as needed. All questions have been answered.    EKG Interpretation  Date/Time:  Thursday December 12 2019  05:50:09 EST Ventricular Rate:  58 PR Interval:  142 QRS Duration: 82 QT Interval:  434 QTC Calculation: 426 R Axis:   79 Text Interpretation: Sinus bradycardia with sinus arrhythmia Septal infarct , age undetermined Abnormal ECG No old tracing to compare Confirmed by Rishaan Gunner, Baxter Hire [phone]) on 12/12/2019 5:56:39 AM            Pryor Montes was evaluated in Emergency Department on 12/12/2019 for the symptoms described in the history of present illness. She was evaluated in the context of the global COVID-19 pandemic, which necessitated consideration that the patient might be at risk for infection with the SARS-CoV-2 virus that causes COVID-19. Institutional protocols and algorithms that pertain to the evaluation of patients at risk for COVID-19 are in a state of rapid change based on information released by regulatory bodies including the CDC and federal and state organizations. These policies and algorithms were followed during the patient's care in the ED.      Caidon Foti, Layla Maw, DO 12/12/19 [phone]

## 2019-12-12 NOTE — Discharge Instructions (Signed)
You may alternate Tylenol 1000 mg every 6 hours as needed for pain, fever and Ibuprofen 800 mg every 8 hours as needed for pain, fever.  Please take Ibuprofen with food.  Do not take more than 4000 mg of Tylenol (acetaminophen) in a 24 hour period.  Steps to find a Primary Care Provider (PCP):  Call [phone] or [phone] to access "[facility] Find a Doctor Service."  2.  You may also go on the [facility] website at [email or web address]  3.  [facility] also frequently accepts new patients.  [facility]  [address] [place] [address] [phone]  4.  There are also multiple [facility], [facility] and [facility] practices throughout the [place] that are frequently accepting new patients. You may find a clinic that is close to your home and contact them.  [email or web address] [phone]  [email or web address]  [email or web address] [phone]  [email or web address] [phone]  5.  Local Health Departments also can provide primary care services.  [facility]  [address] [place], [place] [address] [phone]  [facility] [address] [place] [address] [phone]  [facility] [address]  [place] [address] [phone]   

## 2019-12-12 NOTE — ED Triage Notes (Signed)
The pt is c/o a headache for one month she has not taken anything for her headache  She reports that she was crying and her daughter told her to come to the hospital  She took a uber here  lmp nov 13th

## 2020-02-05 ENCOUNTER — Ambulatory Visit
Admission: EM | Admit: 2020-02-05 | Discharge: 2020-02-05 | Disposition: A | Discharge status: Home / Self Care | Payer: Medicaid Other | Attending: Urgent Care | Admitting: Urgent Care

## 2020-02-05 ENCOUNTER — Inpatient Hospital Stay (HOSPITAL_COMMUNITY): Admission: RE | Admit: 2020-02-05 | Payer: Medicaid Other | Source: Ambulatory Visit

## 2020-02-05 ENCOUNTER — Other Ambulatory Visit: Payer: Self-pay

## 2020-02-05 DIAGNOSIS — R100 Acute abdomen: Secondary | ICD-10-CM

## 2020-02-05 DIAGNOSIS — R102 Pelvic and perineal pain: Secondary | ICD-10-CM | POA: Diagnosis not present

## 2020-02-05 DIAGNOSIS — R350 Frequency of micturition: Secondary | ICD-10-CM | POA: Insufficient documentation

## 2020-02-05 DIAGNOSIS — R300 Dysuria: Secondary | ICD-10-CM | POA: Diagnosis present

## 2020-02-05 LAB — POCT URINALYSIS DIP (MANUAL ENTRY)
Bilirubin, UA: NEGATIVE
Glucose, UA: NEGATIVE mg/dL
Leukocytes, UA: NEGATIVE
Nitrite, UA: NEGATIVE
Protein Ur, POC: NEGATIVE mg/dL
Spec Grav, UA: >=1.03 — AB (ref 1.010–1.025)
Urobilinogen, UA: 0.2 E.U./dL
pH, UA: 6 (ref 5.0–8.0)

## 2020-02-05 LAB — POCT URINE PREGNANCY: Preg Test, Ur: NEGATIVE

## 2020-02-05 MED ORDER — NITROFURANTOIN MONOHYD MACRO 100 MG PO CAPS: 100.0000 mg | ORAL_CAPSULE | Freq: Two times a day (BID) | ORAL | 0 refills | Status: DC

## 2020-02-05 MED ORDER — TAMSULOSIN HCL 0.4 MG PO CAPS: 0.4000 mg | ORAL_CAPSULE | Freq: Every day | ORAL | 0 refills | Status: DC

## 2020-02-05 MED ORDER — KETOROLAC TROMETHAMINE 60 MG/2ML IM SOLN
60.0000 mg | Freq: Once | INTRAMUSCULAR | Status: AC
  Administered 2020-02-05: 60 mg via INTRAMUSCULAR

## 2020-02-05 NOTE — ED Provider Notes (Signed)
[facility]   MRN: [id number] DOB: [birth year]  Subjective:   Sue Clayton is a 35 y.o. female presenting for 5-day history of dysuria, urinary frequency, pelvic pain, left-sided flank pain that radiates to her low back.  Patient has a history of BPH, kidney stones.  She hydrates very well with water.  Has 1 cup of coffee in the morning.  Denies fever, nausea, vomiting, hematuria, vaginal discharge.  She has very little concern for STIs but is not opposed to getting checked.  Wants to make sure that she does not have bacterial vaginosis as it has previously presented with pelvic pain for her.  She also has a history of yeast infection vaginally but does not think this is it today.  No current facility-administered medications for this encounter.  Current Outpatient Medications:  .  ibuprofen (ADVIL) 800 MG tablet, Take 1 tablet (800 mg total) by mouth every 8 (eight) hours as needed for mild pain., Disp: 30 tablet, Rfl: 0 .  azithromycin (ZITHROMAX) 250 MG tablet, Take 1 tablet (250 mg total) by mouth daily. Take first 2 tablets together, then 1 every day until finished., Disp: 6 tablet, Rfl: 0 .  methocarbamol (ROBAXIN) 500 MG tablet, Take 1 tablet (500 mg total) by mouth every 8 (eight) hours as needed for muscle spasms., Disp: 15 tablet, Rfl: 0   No Known Allergies  Past Medical History:  Diagnosis Date  . Anxiety   . Depression   . Gall stones   . Kidney stones   . Mental disorder      Past Surgical History:  Procedure Laterality Date  . CESAREAN SECTION    . LITHOTRIPSY      History reviewed. No pertinent family history.  Social History   Tobacco Use  . Smoking status: Current Every Day Smoker    Packs/day: 0.50    Types: Cigarettes  . Smokeless tobacco: Never Used  Vaping Use  . Vaping Use: Never used  Substance Use Topics  . Alcohol use: Yes    Comment: every other weekend  . Drug use: No    ROS   Objective:   Vitals: BP 131/90 (BP  Location: Left Arm)   Pulse 69   Temp 98 F (36.7 C) (Oral)   Resp 20   SpO2 100%   Physical Exam Constitutional:      General: She is not in acute distress.    Appearance: Normal appearance. She is well-developed. She is not ill-appearing.  HENT:     Head: Normocephalic and atraumatic.     Nose: Nose normal.     Mouth/Throat:     Mouth: Mucous membranes are moist.     Pharynx: Oropharynx is clear.  Eyes:     General: No scleral icterus.    Extraocular Movements: Extraocular movements intact.     Pupils: Pupils are equal, round, and reactive to light.  Cardiovascular:     Rate and Rhythm: Normal rate.  Pulmonary:     Effort: Pulmonary effort is normal.  Abdominal:     General: Bowel sounds are normal. There is no distension.     Palpations: Abdomen is soft. There is no mass.     Tenderness: There is abdominal tenderness in the right lower quadrant, periumbilical area, suprapubic area and left lower quadrant. There is no right CVA tenderness, left CVA tenderness, guarding or rebound.  Skin:    General: Skin is warm and dry.  Neurological:     General: No focal deficit present.  Mental Status: She is alert and oriented to person, place, and time.  Psychiatric:        Mood and Affect: Mood normal.        Behavior: Behavior normal.        Thought Content: Thought content normal.        Judgment: Judgment normal.     Results for orders placed or performed during the hospital encounter of 02/05/20 (from the past 24 hour(s))  POCT urinalysis dipstick     Status: Abnormal   Collection Time: 02/05/20  2:49 PM  Result Value Ref Range   Color, UA yellow yellow   Clarity, UA clear clear   Glucose, UA negative negative mg/dL   Bilirubin, UA negative negative   Ketones, POC UA trace (5) (A) negative mg/dL   Spec Grav, UA >=1.610 (A) 1.010 - 1.025   Blood, UA trace-intact (A) negative   pH, UA 6.0 5.0 - 8.0   Protein Ur, POC negative negative mg/dL   Urobilinogen, UA 0.2  0.2 or 1.0 E.U./dL   Nitrite, UA Negative Negative   Leukocytes, UA Negative Negative  POCT urine pregnancy     Status: None   Collection Time: 02/05/20  2:49 PM  Result Value Ref Range   Preg Test, Ur Negative Negative    Assessment and Plan :   PDMP not reviewed this encounter.  1. Pelvic pain   2. Dysuria   3. Urinary frequency     Will cover for acute cystitis with Macrobid, urine culture pending.  Recommended continued hydration with plain water.  In light of her having a history of kidney stones, discussed using tamsulosin.  Cervical swab pending.  Strict ER precautions. Counseled patient on potential for adverse effects with medications prescribed today, patient verbalized understanding.    Wallis Bamberg, PA-C 02/05/20 [id number]

## 2020-02-05 NOTE — ED Triage Notes (Signed)
Patient complains of painful urination as well as frequency and urgency x 5 days. Pt states she has now had pain radiating to her left flank x 3 days. Pt is aox4 and ambulatory.

## 2020-02-05 NOTE — Discharge Instructions (Addendum)
Please just use Tylenol at a dose of 500mg-650mg once every 6 hours as needed for your aches, pains, fevers. Do not use any nonsteroidal anti-inflammatories (NSAIDs) like ibuprofen, Motrin, naproxen, Aleve, etc. which are all available over-the-counter.   

## 2020-02-06 LAB — URINE CULTURE: Culture: NO GROWTH

## 2020-02-07 LAB — CERVICOVAGINAL ANCILLARY ONLY
Bacterial Vaginitis (gardnerella): NEGATIVE
Chlamydia: NEGATIVE
Comment: NEGATIVE
Comment: NEGATIVE
Comment: NEGATIVE
Comment: NORMAL
Neisseria Gonorrhea: NEGATIVE
Trichomonas: NEGATIVE

## 2020-06-06 ENCOUNTER — Encounter (HOSPITAL_COMMUNITY): Payer: Self-pay | Admitting: Obstetrics and Gynecology

## 2020-06-06 ENCOUNTER — Other Ambulatory Visit: Payer: Self-pay

## 2020-06-06 ENCOUNTER — Inpatient Hospital Stay (HOSPITAL_COMMUNITY)
Admission: AD | Admit: 2020-06-06 | Discharge: 2020-06-06 | Disposition: A | Discharge status: Home / Self Care | Payer: Medicaid Other | Attending: Obstetrics and Gynecology | Admitting: Obstetrics and Gynecology

## 2020-06-06 ENCOUNTER — Inpatient Hospital Stay (HOSPITAL_COMMUNITY): Payer: Medicaid Other

## 2020-06-06 DIAGNOSIS — O98311 Other infections with a predominantly sexual mode of transmission complicating pregnancy, first trimester: Secondary | ICD-10-CM | POA: Diagnosis not present

## 2020-06-06 DIAGNOSIS — O26899 Other specified pregnancy related conditions, unspecified trimester: Secondary | ICD-10-CM

## 2020-06-06 DIAGNOSIS — O3680X0 Pregnancy with inconclusive fetal viability, not applicable or unspecified: Secondary | ICD-10-CM | POA: Insufficient documentation

## 2020-06-06 DIAGNOSIS — A5901 Trichomonal vulvovaginitis: Secondary | ICD-10-CM | POA: Insufficient documentation

## 2020-06-06 DIAGNOSIS — O99331 Smoking (tobacco) complicating pregnancy, first trimester: Secondary | ICD-10-CM | POA: Diagnosis not present

## 2020-06-06 DIAGNOSIS — R102 Pelvic and perineal pain: Secondary | ICD-10-CM | POA: Diagnosis not present

## 2020-06-06 DIAGNOSIS — F17210 Nicotine dependence, cigarettes, uncomplicated: Secondary | ICD-10-CM | POA: Diagnosis not present

## 2020-06-06 DIAGNOSIS — Z3A01 Less than 8 weeks gestation of pregnancy: Secondary | ICD-10-CM | POA: Insufficient documentation

## 2020-06-06 DIAGNOSIS — O26891 Other specified pregnancy related conditions, first trimester: Secondary | ICD-10-CM

## 2020-06-06 HISTORY — DX: Unspecified infectious disease: B99.9

## 2020-06-06 HISTORY — DX: Unspecified abnormal cytological findings in specimens from vagina: R87.629

## 2020-06-06 LAB — URINALYSIS, ROUTINE W REFLEX MICROSCOPIC
Bacteria, UA: NONE SEEN
Bilirubin Urine: NEGATIVE
Glucose, UA: NEGATIVE mg/dL
Ketones, ur: NEGATIVE mg/dL
Nitrite: NEGATIVE
Protein, ur: NEGATIVE mg/dL
Specific Gravity, Urine: 1.025 (ref 1.005–1.030)
pH: 5 (ref 5.0–8.0)

## 2020-06-06 LAB — HCG, QUANTITATIVE, PREGNANCY: hCG, Beta Chain, Quant, S: 100 m[IU]/mL — ABNORMAL HIGH (ref <5)

## 2020-06-06 LAB — CBC
HCT: 40.4 % (ref 36.0–46.0)
Hemoglobin: 13.1 g/dL (ref 12.0–15.0)
MCH: 26.4 pg (ref 26.0–34.0)
MCHC: 32.4 g/dL (ref 30.0–36.0)
MCV: 81.5 fL (ref 80.0–100.0)
Platelets: 301 10*3/uL (ref 150–400)
RBC: 4.96 MIL/uL (ref 3.87–5.11)
RDW: 15 % (ref 11.5–15.5)
WBC: 9.5 10*3/uL (ref 4.0–10.5)
nRBC: 0 % (ref 0.0–0.2)

## 2020-06-06 LAB — WET PREP, GENITAL
Sperm: NONE SEEN
Yeast Wet Prep HPF POC: NONE SEEN

## 2020-06-06 LAB — POCT PREGNANCY, URINE: Preg Test, Ur: POSITIVE — AB

## 2020-06-06 MED ORDER — METRONIDAZOLE 500 MG PO TABS
2000.0000 mg | ORAL_TABLET | Freq: Once | ORAL | Status: AC
  Administered 2020-06-06: 2000 mg via ORAL
  Filled 2020-06-06: qty 4

## 2020-06-06 NOTE — MAU Provider Note (Signed)
History     CSN: [id number]  Arrival date and time: 06/06/20 1216   Event Date/Time   First Provider Initiated Contact with Patient 06/06/20 1347      Chief Complaint  Patient presents with  . Abdominal Pain  . Pelvic Pain   HPI Sue Clayton is a 35 y.o. [id number] at Unknown gestational age who presents with pelvic pain. She states it started 2 weeks ago and now it is impacting her sleep. She reports the pain is also in her lower abdomen. She feels like her lower abdomen and pelvis are all inflamed. She also reports an increase in vaginal discharge. She denies any bleeding. She is shocked and upset that she is pregnant.   OB History    Gravida  10   Para  4   Term  4   Preterm      AB  5   Living  4     SAB      IAB  5   Ectopic      Multiple      Live Births  4           Past Medical History:  Diagnosis Date  . Anxiety   . Depression   . Gall stones   . Infection    UTI  . Kidney stones   . Mental disorder   . Vaginal Pap smear, abnormal    colpo- ok since    Past Surgical History:  Procedure Laterality Date  . CESAREAN SECTION    . LITHOTRIPSY      Family History  Problem Relation Age of Onset  . Hypertension Mother   . Diabetes Mother     Social History   Tobacco Use  . Smoking status: Current Every Day Smoker    Packs/day: 1.00    Years: 19.00    Pack years: 19.00    Types: Cigarettes  . Smokeless tobacco: Never Used  Vaping Use  . Vaping Use: Never used  Substance Use Topics  . Alcohol use: Yes    Comment: drinking every day due to stress  . Drug use: Yes    Frequency: 2.0 times per week    Types: Marijuana    Allergies: No Known Allergies  Medications Prior to Admission  Medication Sig Dispense Refill Last Dose  . ibuprofen (ADVIL) 800 MG tablet Take 1 tablet (800 mg total) by mouth every 8 (eight) hours as needed for mild pain. 30 tablet 0 06/05/2020 at Unknown time  . nitrofurantoin, macrocrystal-monohydrate,  (MACROBID) 100 MG capsule Take 1 capsule (100 mg total) by mouth 2 (two) times daily. 10 capsule 0   . tamsulosin (FLOMAX) 0.4 MG CAPS capsule Take 1 capsule (0.4 mg total) by mouth daily after supper. 30 capsule 0     Review of Systems  Constitutional: Negative.  Negative for fatigue and fever.  HENT: Negative.   Respiratory: Negative.  Negative for shortness of breath.   Cardiovascular: Negative.  Negative for chest pain.  Gastrointestinal: Positive for abdominal pain. Negative for constipation, diarrhea, nausea and vomiting.  Genitourinary: Positive for pelvic pain and vaginal discharge. Negative for dysuria and vaginal bleeding.  Neurological: Negative.  Negative for dizziness and headaches.   Physical Exam   Blood pressure 134/84, pulse 86, temperature 98 F (36.7 C), temperature source Oral, resp. rate 20, height 5\' 7"  (1.702 m), weight 107.6 kg, last menstrual period 05/30/2020, SpO2 99 %.  Physical Exam Vitals and nursing note reviewed.  Constitutional:  General: She is not in acute distress.    Appearance: She is well-developed.  HENT:     Head: Normocephalic.  Eyes:     Pupils: Pupils are equal, round, and reactive to light.  Cardiovascular:     Rate and Rhythm: Normal rate and regular rhythm.     Heart sounds: Normal heart sounds.  Pulmonary:     Effort: Pulmonary effort is normal. No respiratory distress.     Breath sounds: Normal breath sounds.  Abdominal:     General: Bowel sounds are normal. There is no distension.     Palpations: Abdomen is soft.     Tenderness: There is no abdominal tenderness.  Skin:    General: Skin is warm and dry.  Neurological:     Mental Status: She is alert and oriented to person, place, and time.  Psychiatric:        Behavior: Behavior normal.        Thought Content: Thought content normal.        Judgment: Judgment normal.     MAU Course  Procedures Results for orders placed or performed during the hospital encounter of  06/06/20 (from the past 24 hour(s))  Urinalysis, Routine w reflex microscopic     Status: Abnormal   Collection Time: 06/06/20 12:43 PM  Result Value Ref Range   Color, Urine YELLOW YELLOW   APPearance HAZY (A) CLEAR   Specific Gravity, Urine 1.025 1.005 - 1.030   pH 5.0 5.0 - 8.0   Glucose, UA NEGATIVE NEGATIVE mg/dL   Hgb urine dipstick SMALL (A) NEGATIVE   Bilirubin Urine NEGATIVE NEGATIVE   Ketones, ur NEGATIVE NEGATIVE mg/dL   Protein, ur NEGATIVE NEGATIVE mg/dL   Nitrite NEGATIVE NEGATIVE   Leukocytes,Ua TRACE (A) NEGATIVE   RBC / HPF 6-10 0 - 5 RBC/hpf   WBC, UA 6-10 0 - 5 WBC/hpf   Bacteria, UA NONE SEEN NONE SEEN   Squamous Epithelial / LPF 0-5 0 - 5   Mucus PRESENT   Pregnancy, urine POC     Status: Abnormal   Collection Time: 06/06/20 12:56 PM  Result Value Ref Range   Preg Test, Ur POSITIVE (A) NEGATIVE  CBC     Status: None   Collection Time: 06/06/20 12:59 PM  Result Value Ref Range   WBC 9.5 4.0 - 10.5 K/uL   RBC 4.96 3.87 - 5.11 MIL/uL   Hemoglobin 13.1 12.0 - 15.0 g/dL   HCT 47.0 96.2 - 83.6 %   MCV 81.5 80.0 - 100.0 fL   MCH 26.4 26.0 - 34.0 pg   MCHC 32.4 30.0 - 36.0 g/dL   RDW 62.9 47.6 - 54.6 %   Platelets 301 150 - 400 K/uL   nRBC 0.0 0.0 - 0.2 %  hCG, quantitative, pregnancy     Status: Abnormal   Collection Time: 06/06/20 12:59 PM  Result Value Ref Range   hCG, Beta Chain, Quant, S 100 (H) <5 mIU/mL  Wet prep, genital     Status: Abnormal   Collection Time: 06/06/20  1:59 PM   Specimen: Vaginal  Result Value Ref Range   Yeast Wet Prep HPF POC NONE SEEN NONE SEEN   Trich, Wet Prep PRESENT (A) NONE SEEN   Clue Cells Wet Prep HPF POC PRESENT (A) NONE SEEN   WBC, Wet Prep HPF POC FEW (A) NONE SEEN   Sperm NONE SEEN    US OB LESS THAN 14 WEEKS WITH OB TRANSVAGINAL  Result Date: 06/06/2020 CLINICAL  DATA:  Abdominal pain. Positive beta HCG 100. Estimated gestational age by last menstrual period equals 5 weeks 2 days. EXAM: OBSTETRIC <14 WK [name] AND  TRANSVAGINAL OB US TECHNIQUE: Both transabdominal and transvaginal ultrasound examinations were performed for complete evaluation of the gestation as well as the maternal uterus, adnexal regions, and pelvic cul-de-sac. Transvaginal technique was performed to assess early pregnancy. COMPARISON:  None. FINDINGS: Intrauterine gestational sac: Not identified Yolk sac:  Not identified Embryo:  Not identified Subchorionic hemorrhage:  None Maternal uterus/adnexae: Normal ovaries.  No free fluid. IMPRESSION: No intrauterine gestational sac, yolk sac, or fetal pole identified. Differential considerations include intrauterine pregnancy too early to be sonographically visualized, missed abortion, or ectopic pregnancy. Followup ultrasound is recommended in 10-14 days for further evaluation. Electronically Signed   By: Genevive Bi M.D.   On: 06/06/2020 14:46   MDM UA, UPT CBC, HCG ABO/Rh- O Pos Wet prep and gc/chlamydia US OB Comp Less 14 weeks with Transvaginal  Discussed with client the diagnosis of pregnancy of unknown anatomic location.  Three possibilities of outcome are: a healthy pregnancy that is too early to see a yolk sac to confirm the pregnancy is in the uterus, a pregnancy that is not healthy and has not developed and will not develop, and an ectopic pregnancy that is in the abdomen that cannot be identified at this time.  And ectopic pregnancy can be a life threatening situation as a pregnancy needs to be in the uterus which is a muscle and can stretch to accommodate the growth of a pregnancy.  Other structures in the pelvis and abdomen as not muscular and do not stretch with the growth of a pregnancy.  Worst case scenario is that a structure ruptures with a growing pregnancy not in the uterus and and internal hemorrhage can be a life threatening situation.  We need to follow the progression of this pregnancy carefully.  We need to check another serum pregnancy hormone level to determine if the  levels are rising appropriately  and to determine the next steps that are needed for you. Patient's questions were answered.  Patient requested treatment while in MAU for trich. Metronidazole 2g PO  Assessment and Plan   1. Pregnancy of unknown anatomic location   2. Pelvic pain affecting pregnancy   3. [redacted] weeks gestation of pregnancy   4. Trichomonal vaginitis during pregnancy in first trimester    -Discharge home in stable condition -Strict ectopic precautions discussed -Patient advised to follow-up with [facility] on 6/7 for repeat HCG -Patient may return to MAU as needed or if her condition were to change or worsen   Rolm Bookbinder CNM 06/06/2020, 1:47 PM

## 2020-06-06 NOTE — Progress Notes (Signed)
States is very stressed and depressed, though "would never hurt herself, her kids keep her going".  Does not have a good support system

## 2020-06-06 NOTE — Discharge Instructions (Signed)
Safe Medications in Pregnancy   Acne: Benzoyl Peroxide Salicylic Acid  Backache/Headache: Tylenol: 2 regular strength every 4 hours OR              2 Extra strength every 6 hours  Colds/Coughs/Allergies: Benadryl (alcohol free) 25 mg every 6 hours as needed Breath right strips Claritin Cepacol throat lozenges Chloraseptic throat spray Cold-Eeze- up to three times per day Cough drops, alcohol free Flonase (by prescription only) Guaifenesin Mucinex Robitussin DM (plain only, alcohol free) Saline nasal spray/drops Sudafed (pseudoephedrine) & Actifed ** use only after [redacted] weeks gestation and if you do not have high blood pressure Tylenol Vicks Vaporub Zinc lozenges Zyrtec   Constipation: Colace Ducolax suppositories Fleet enema Glycerin suppositories Metamucil Milk of magnesia Miralax Senokot Smooth move tea  Diarrhea: Kaopectate Imodium A-D  *NO pepto Bismol  Hemorrhoids: Anusol Anusol HC Preparation H Tucks  Indigestion: Tums Maalox Mylanta Zantac  Pepcid  Insomnia: Benadryl (alcohol free) 25mg  every 6 hours as needed Tylenol PM Unisom, no Gelcaps  Leg Cramps: Tums MagGel  Nausea/Vomiting:  Bonine Dramamine Emetrol Ginger extract Sea bands Meclizine  Nausea medication to take during pregnancy:  Unisom (doxylamine succinate 25 mg tablets) Take one tablet daily at bedtime. If symptoms are not adequately controlled, the dose can be increased to a maximum recommended dose of two tablets daily (1/2 tablet in the morning, 1/2 tablet mid-afternoon and one at bedtime). Vitamin B6 100mg  tablets. Take one tablet twice a day (up to 200 mg per day).  Skin Rashes: Aveeno products Benadryl cream or 25mg  every 6 hours as needed Calamine Lotion 1% cortisone cream  Yeast infection: Gyne-lotrimin 7 Monistat 7   **If taking multiple medications, please check labels to avoid duplicating the same active ingredients **take medication as directed on  the label ** Do not exceed 4000 mg of tylenol in 24 hours **Do not take medications that contain aspirin or ibuprofen      Abdominal Pain During Pregnancy Abdominal pain is common during pregnancy and has many possible causes. Some causes are more serious than others, and sometimes the cause is not known. Abdominal pain can be a sign that labor is starting. It can also be caused by normal growth of your baby causing stretching of muscles and ligaments during pregnancy. Always tell your health care provider if you have any abdominal pain. Follow these instructions at home:  Do not have sex or put anything in your vagina until your pain goes away completely.  Get plenty of rest until your pain improves.  Drink enough fluid to keep your urine pale yellow.  Take over-the-counter and prescription medicines only as told by your health care provider.  Keep all follow-up visits. This is important.   Contact a health care provider if:  Your pain continues or gets worse after resting.  You have lower abdominal pain that: ? Comes and goes at regular intervals. ? Spreads to your back. ? Is similar to menstrual cramps.  You have pain or burning when you urinate. Get help right away if:  You have a fever, chills, or shortness of breath.  You have vaginal bleeding.  You are leaking fluid or passing tissue from your vagina.  You have vomiting or diarrhea that lasts for more than 24 hours.  Your baby is moving less than usual.  You feel very weak or faint.  You develop severe pain in your upper abdomen. Summary  Abdominal pain is common during pregnancy and has many possible causes.  If  you experience abdominal pain during pregnancy, tell your health care provider right away.  Follow your health care provider's home care instructions and keep all follow-up visits as told. This information is not intended to replace advice given to you by your health care provider. Make sure you  discuss any questions you have with your health care provider. Document Revised: 09/03/2019 Document Reviewed: 09/03/2019 Elsevier Patient Education  2021 Elsevier Inc.  

## 2020-06-06 NOTE — MAU Note (Signed)
Pain in lower abd and pelvic area, goes around to back- all the way up.  Started 2 wks ago, now is so bad, can't sleep. Pelvic pain is worse after she releases urine.  Had period last month.  Feels like lower abd and pelvic are all inflamed.

## 2020-06-08 LAB — GC/CHLAMYDIA PROBE AMP (~~LOC~~) NOT AT ARMC
Chlamydia: NEGATIVE
Comment: NEGATIVE
Comment: NORMAL
Neisseria Gonorrhea: NEGATIVE

## 2020-06-09 ENCOUNTER — Ambulatory Visit (INDEPENDENT_AMBULATORY_CARE_PROVIDER_SITE_OTHER): Payer: Medicaid Other

## 2020-06-09 ENCOUNTER — Other Ambulatory Visit: Payer: Self-pay

## 2020-06-09 ENCOUNTER — Telehealth: Payer: Self-pay

## 2020-06-09 ENCOUNTER — Ambulatory Visit: Payer: Medicaid Other

## 2020-06-09 VITALS — BP 118/86 | HR 84 | Wt 239.2 lb

## 2020-06-09 DIAGNOSIS — O3680X0 Pregnancy with inconclusive fetal viability, not applicable or unspecified: Secondary | ICD-10-CM

## 2020-06-09 LAB — BETA HCG QUANT (REF LAB): hCG Quant: 386 m[IU]/mL

## 2020-06-09 NOTE — Telephone Encounter (Signed)
Called pt to follow up on missed appt this morning. Pt states she was without transportation this morning. Pt agreeable to using [employer] and rescheduling for later today. Pt registered and ride schedule for 11:30 appt today.

## 2020-06-09 NOTE — Progress Notes (Signed)
Sue Clayton presents to [facility] for follow-up HCG blood draw today. She was seen in MAU for abdominal pain on 06/06/20. Patient endorses pain today, denies bleeding. Discussed with patient, we are following hCG levels today. Results will be back in approximately 2 hours. Valid contact number for patient confirmed. I will call the patient with results.   Beta HCG today is 386, increased from 100 on 06/06/20. Results and patient history reviewed with Myriam Jacobson, MD who states this is an appropriate rise in levels. Patient called and informed of plan for follow-up. [name] scheduled for 06/25/20 at 1100 to confirm viability and location of pregnancy.  Marjo Bicker 06/09/2020 11:24 AM

## 2020-06-10 NOTE — Progress Notes (Signed)
I reviewed the care plan and agree with documentation in RN note.    Casper Harrison, MD [facility] Family Medicine Fellow, [facility] for [facility], [facility]

## 2020-06-25 ENCOUNTER — Other Ambulatory Visit: Payer: Self-pay

## 2020-06-25 ENCOUNTER — Ambulatory Visit
Admission: RE | Admit: 2020-06-25 | Discharge: 2020-06-25 | Disposition: A | Discharge status: Home / Self Care | Payer: Medicaid Other | Source: Ambulatory Visit | Attending: Obstetrics and Gynecology | Admitting: Obstetrics and Gynecology

## 2020-06-25 ENCOUNTER — Telehealth: Payer: Self-pay | Admitting: Medical

## 2020-06-25 DIAGNOSIS — O3680X0 Pregnancy with inconclusive fetal viability, not applicable or unspecified: Secondary | ICD-10-CM | POA: Insufficient documentation

## 2020-06-25 DIAGNOSIS — Z3480 Encounter for supervision of other normal pregnancy, unspecified trimester: Secondary | ICD-10-CM

## 2020-06-25 MED ORDER — PRENATAL PLUS VITAMIN/MINERAL 27-1 MG PO TABS
1.0000 | ORAL_TABLET | Freq: Every day | ORAL | 11 refills | Status: DC
Start: 2020-06-25 — End: 2022-12-07

## 2020-06-25 NOTE — Telephone Encounter (Signed)
I called Sue Clayton today at 3:09 PM and confirmed patient's identity using two patient identifiers. [name] results from earlier today were reviewed. Patient is not yet scheduled for new OB visit. She has been seen at [facility] previously. I will send a message to make her new OB appointment in ~ 4 weeks. Patient is agreeable. Rx for PNV sent at patient's request. First trimester warning signs reviewed. Patient voiced understanding and had no further questions.   US OB Transvaginal  Result Date: 06/25/2020 CLINICAL DATA:  Pregnancy of unknown anatomic location EXAM: TRANSVAGINAL OB ULTRASOUND TECHNIQUE: Transvaginal ultrasound was performed for complete evaluation of the gestation as well as the maternal uterus, adnexal regions, and pelvic cul-de-sac. COMPARISON:  06/06/2020 FINDINGS: Intrauterine gestational sac: Present, single Yolk sac:  Present Embryo:  Present Cardiac Activity: Present Heart Rate: 114 bpm CRL:   4.0 mm   6 w 1 d                  [name] EDC: 02/17/2021 Subchorionic hemorrhage:  None visualized. Maternal uterus/adnexae: RIGHT ovary normal size and morphology 3.7 x 1.2 x 1.5 cm. LEFT ovary measures 2.5 x 2.1 x 3.0 cm and contains a corpus luteum. Maternal uterus anteverted, with Caesarean section scar at the anterior wall. Trace free pelvic fluid. No adnexal masses. IMPRESSION: Single live intrauterine gestation at 6 weeks 1 day EGA by crown-rump length. No acute abnormalities. Electronically Signed   By: Ulyses Southward M.D.   On: 06/25/2020 12:20    Marny Lowenstein, PA-C 06/25/2020 3:09 PM

## 2020-07-21 IMAGING — US US RENAL
1 series · 15 of 25 positions shown · non-contrast
Comparison: Prior CT from 02/20/2015

CLINICAL DATA: Initial evaluation for acute left lower quadrant
pain for 1 week, early pregnancy.

EXAM:
RENAL / URINARY TRACT ULTRASOUND COMPLETE

[Series 1: us renal · 42 acquisitions, 15 frames shown]
[im 1/42]
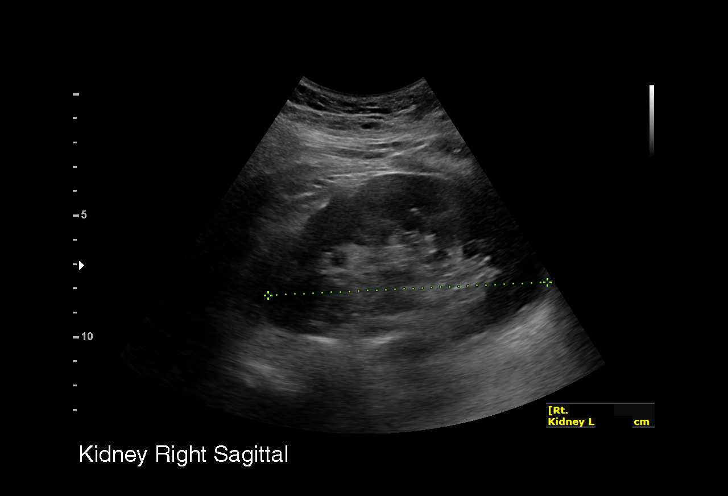
[im 4/42]
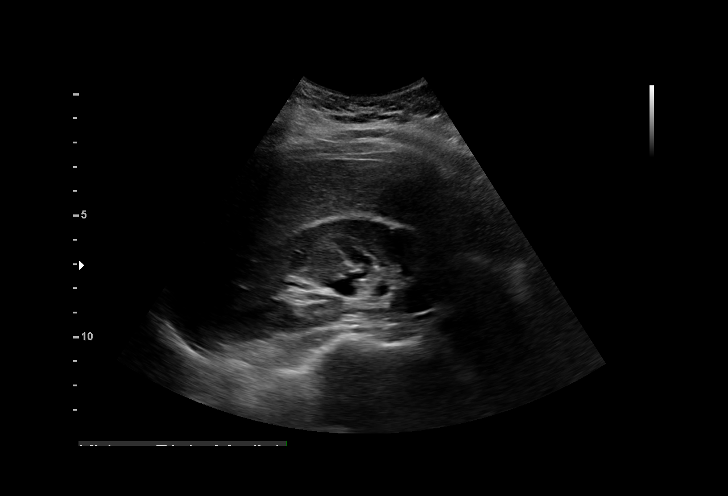
[im 7/42]
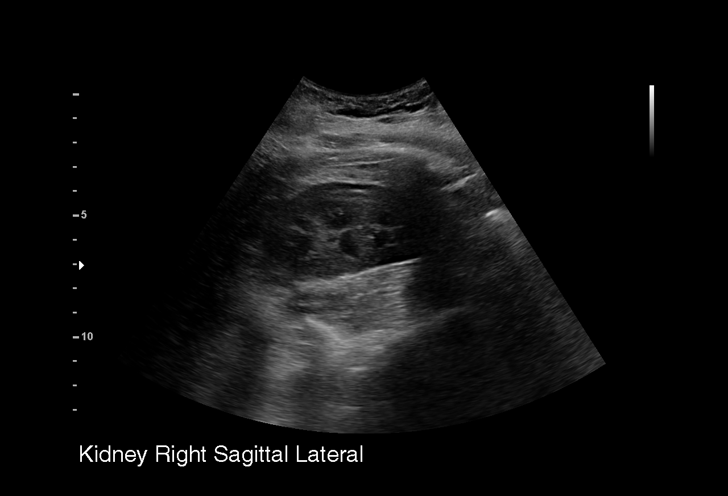
[im 9/42]
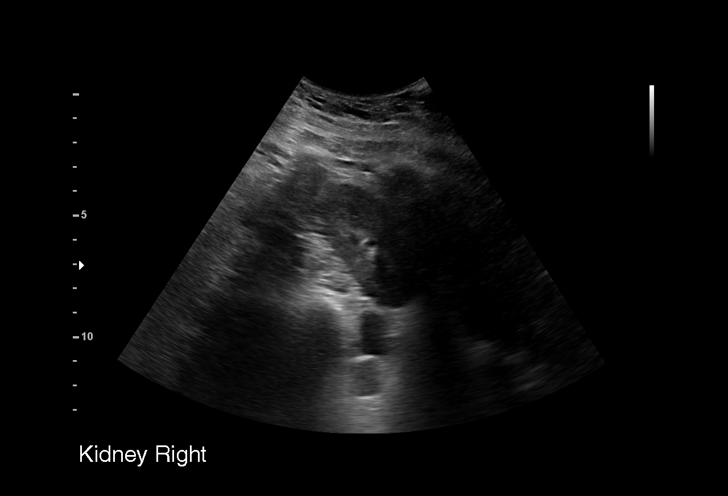
[im 12/42]
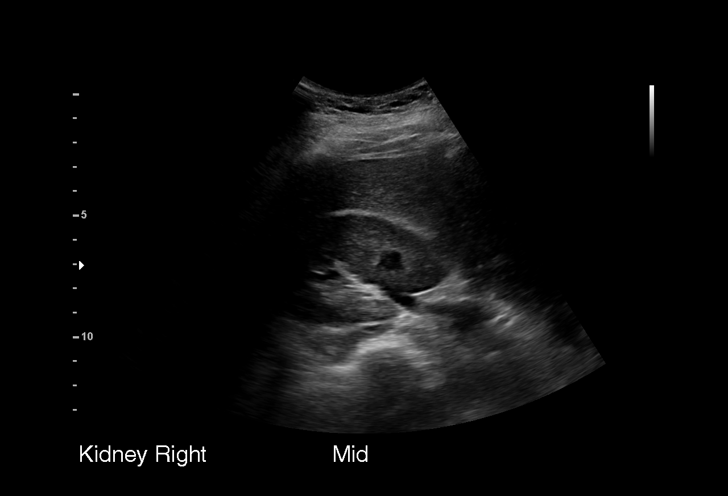
[im 16/42]
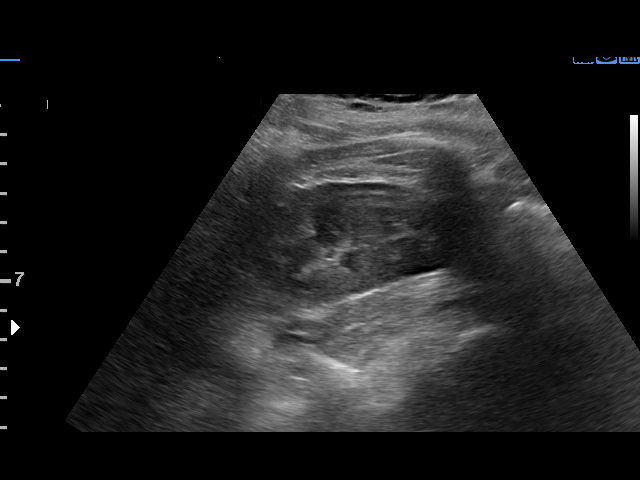
[im 18/42]
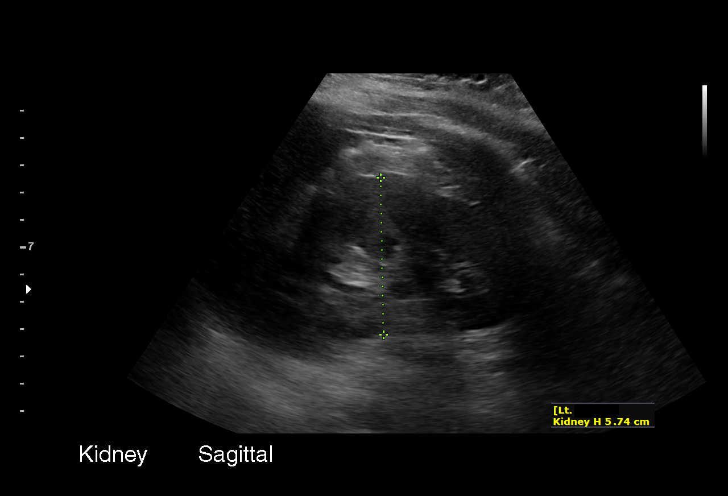
[im 21/42]
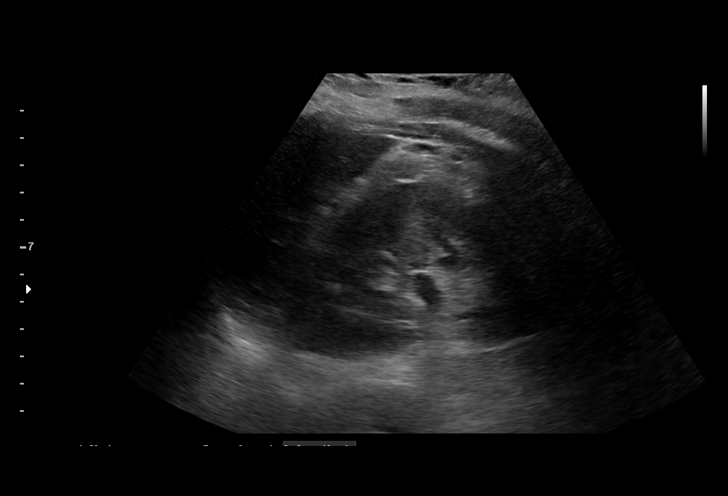
[im 24/42]
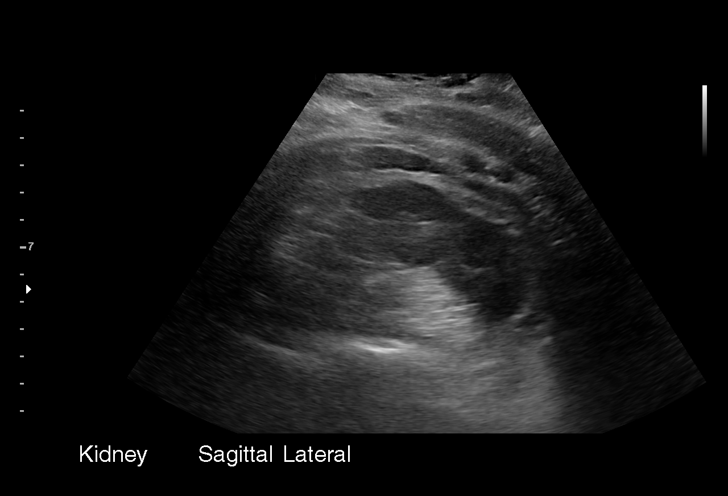
[im 26/42]
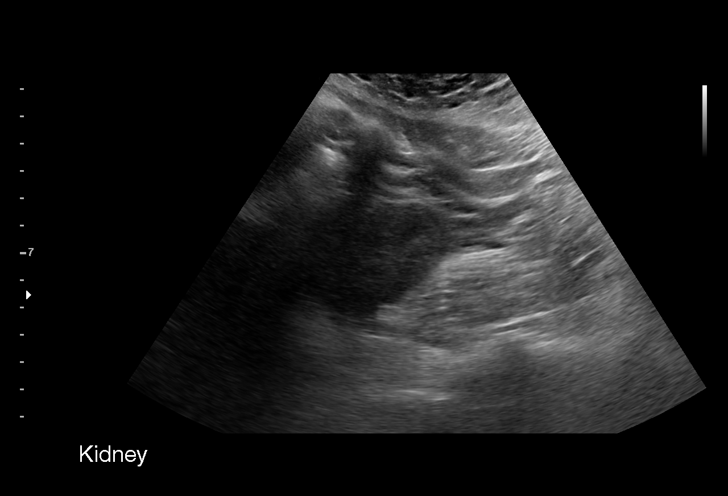
[im 30/42]
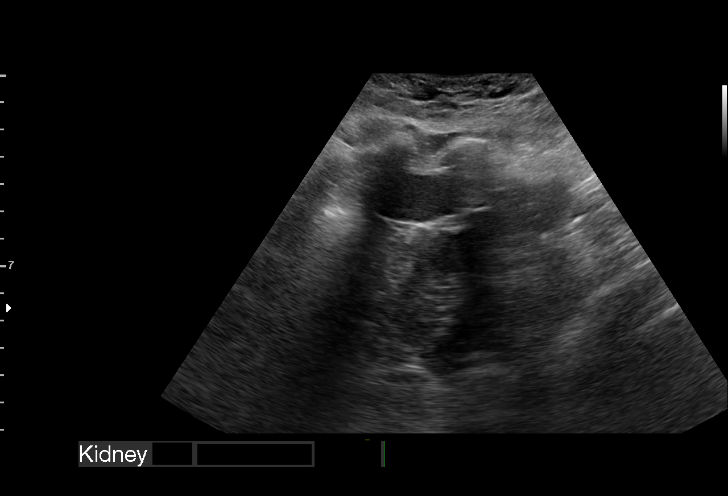
[im 33/42]
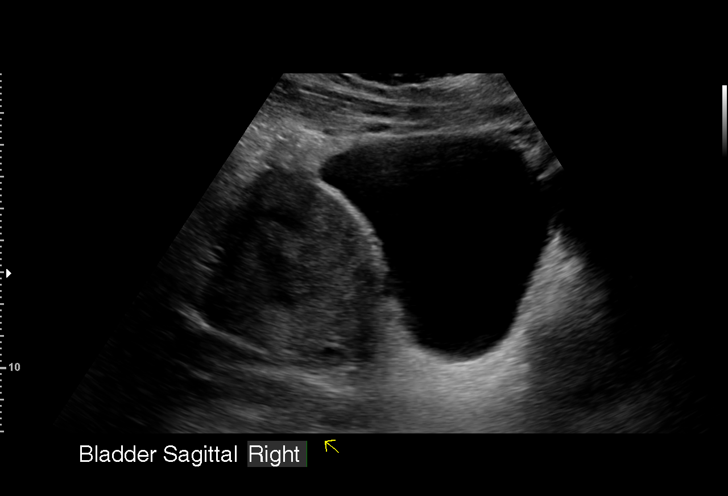
[im 35/42]
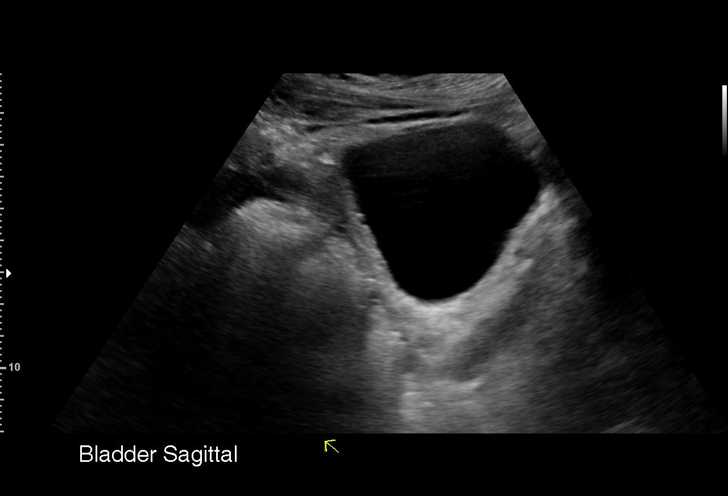
[im 38/42]
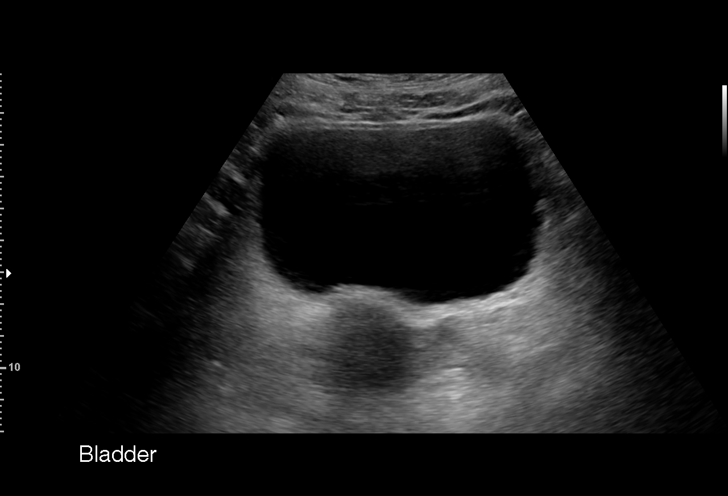
[im 42/42]
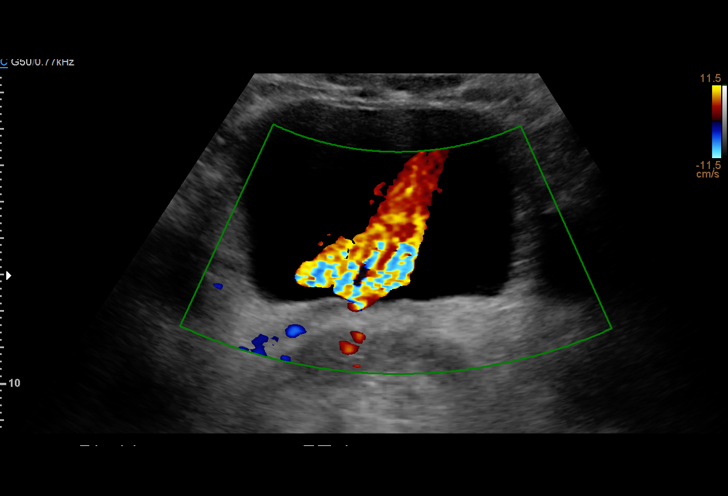

[15 of 25 positions shown; findings below may reference images not displayed]

FINDINGS: Right Kidney:

Renal measurements: 11.5 x 5.4 x 6.3 cm = volume: 203.4 mL .
Echogenicity within normal limits. No mass or hydronephrosis
visualized.

Left Kidney:

Renal measurements: 10.2 x 5.7 x 4.8 cm = volume: 147.7 mL.
Echogenicity within normal limits. No mass or hydronephrosis
visualized.

Bladder:

Appears normal for degree of bladder distention. Bilateral ureteral
jets are visualized.
IMPRESSION: Normal renal ultrasound.  No hydronephrosis or other acute finding.

## 2020-08-01 ENCOUNTER — Other Ambulatory Visit: Payer: Self-pay

## 2020-08-01 ENCOUNTER — Encounter (HOSPITAL_COMMUNITY): Payer: Self-pay | Admitting: Obstetrics & Gynecology

## 2020-08-01 ENCOUNTER — Inpatient Hospital Stay (HOSPITAL_COMMUNITY): Payer: Medicaid Other

## 2020-08-01 ENCOUNTER — Inpatient Hospital Stay (HOSPITAL_COMMUNITY)
Admission: AD | Admit: 2020-08-01 | Discharge: 2020-08-01 | Disposition: A | Discharge status: Home / Self Care | Payer: Medicaid Other | Attending: Obstetrics & Gynecology | Admitting: Obstetrics & Gynecology

## 2020-08-01 DIAGNOSIS — O039 Complete or unspecified spontaneous abortion without complication: Secondary | ICD-10-CM | POA: Insufficient documentation

## 2020-08-01 DIAGNOSIS — O209 Hemorrhage in early pregnancy, unspecified: Secondary | ICD-10-CM | POA: Diagnosis present

## 2020-08-01 LAB — WET PREP, GENITAL
Sperm: NONE SEEN
Trich, Wet Prep: NONE SEEN
Yeast Wet Prep HPF POC: NONE SEEN

## 2020-08-01 MED ORDER — OXYCODONE-ACETAMINOPHEN 5-325 MG PO TABS
1.0000 | ORAL_TABLET | Freq: Once | ORAL | Status: AC
Start: 2020-08-01 — End: 2020-08-01
  Administered 2020-08-01: 1 via ORAL
  Filled 2020-08-01: qty 1

## 2020-08-01 MED ORDER — OXYCODONE-ACETAMINOPHEN 5-325 MG PO TABS: 1.0000 | ORAL_TABLET | ORAL | 0 refills | Status: DC | PRN

## 2020-08-01 NOTE — MAU Provider Note (Signed)
Chief Complaint:  Abdominal Pain and Vaginal Bleeding   Event Date/Time   First Provider Initiated Contact with Patient 08/01/20 1623     HPI: Sue Clayton is a 35 y.o. [id number] at [redacted]w[redacted]d who presents to maternity admissions reporting lower abdominal cramping and vaginal bleeding. Patient reports she started having light vaginal bleeding when she wipes 3 days ago. This morning she reports intermittent lower abdominal cramping started that she rates 9/10. Feels like contractions that come every 5-6 mins. She has not tried anything to relieve pain. She denies urinary s/s, fever or itching, but reports that there is a "dead smell".  Pregnancy Course:   Past Medical History:  Diagnosis Date   Anxiety    Depression    Gall stones    Infection    UTI   Kidney stones    Mental disorder    Vaginal Pap smear, abnormal    colpo- ok since   OB History  Gravida Para Term Preterm AB Living  10 4 4   5 4   SAB IAB Ectopic Multiple Live Births    5     4    # Outcome Date GA Lbr Len/2nd Weight Sex Delivery Anes PTL Lv  10 Current           9 IAB           8 IAB           7 IAB           6 IAB           5 Term      CS-Unspec  N LIV  4 Term      CS-Unspec  N LIV  3 Term      CS-Unspec  N LIV  2 Term      CS-Unspec  N LIV  1 IAB            Past Surgical History:  Procedure Laterality Date   CESAREAN SECTION     LITHOTRIPSY     Family History  Problem Relation Age of Onset   Hypertension Mother    Diabetes Mother    Social History   Tobacco Use   Smoking status: Every Day    Packs/day: 1.00    Years: 19.00    Pack years: 19.00    Types: Cigarettes   Smokeless tobacco: Never  Vaping Use   Vaping Use: Never used  Substance Use Topics   Alcohol use: Yes    Comment: drinking every day due to stress   Drug use: Yes    Frequency: 2.0 times per week    Types: Marijuana   No Known Allergies No medications prior to admission.   I have reviewed patient's Past Medical Hx,  Surgical Hx, Family Hx, Social Hx, medications and allergies.   ROS:  Review of Systems  Constitutional: Negative.   Respiratory: Negative.    Cardiovascular: Negative.   Gastrointestinal:  Positive for abdominal pain (lower abdominal cramping).  Genitourinary:  Positive for vaginal bleeding. Negative for dysuria.  Musculoskeletal: Negative.   Neurological: Negative.   Psychiatric/Behavioral: Negative.     Physical Exam  Patient Vitals for the past 24 hrs:  BP Temp Temp src Pulse Resp SpO2 Height Weight  08/01/20 1825 (!) 132/98 -- -- 95 -- -- -- --  08/01/20 1603 107/82 98.4 F (36.9 C) Oral 96 20 100 % -- --  08/01/20 1600 -- -- -- -- -- -- 5\' 7"  (1.702 m)  114.8 kg   Constitutional: well-developed, well-nourished female in no acute distress.  Cardiovascular: normal rate Respiratory: normal effort GI: abd soft, non-tender MS: extremities nontender, no edema, normal ROM Neurologic: alert and oriented x 4.  GU: neg CVAT. Pelvic: NEFG, small amount of dark red blood cleared with 2 fox swabs, cervix visually closed and clean without lesions/masses    Labs: Results for orders placed or performed during the hospital encounter of 08/01/20 (from the past 24 hour(s))  Wet prep, genital     Status: Abnormal   Collection Time: 08/01/20  4:14 PM  Result Value Ref Range   Yeast Wet Prep HPF POC NONE SEEN NONE SEEN   Trich, Wet Prep NONE SEEN NONE SEEN   Clue Cells Wet Prep HPF POC PRESENT (A) NONE SEEN   WBC, Wet Prep HPF POC MODERATE (A) NONE SEEN   Sperm NONE SEEN     Imaging:  US OB Transvaginal  Result Date: 08/01/2020 CLINICAL DATA:  Vaginal bleeding in 1st trimester pregnancy. EXAM: TRANSVAGINAL OB ULTRASOUND TECHNIQUE: Transvaginal ultrasound was performed for complete evaluation of the gestation as well as the maternal uterus, adnexal regions, and pelvic cul-de-sac. COMPARISON:  06/25/2020 FINDINGS: Intrauterine gestational sac: Single, with abnormal shape and location in  the lower uterine segment noted Yolk sac:  Not Visualized. Embryo:  Visualized. Cardiac Activity: Not Visualized. CRL:   5 mm   6 w 1 d                  [name] EDC: 03/26/2021 Subchorionic hemorrhage:  None visualized. Maternal uterus/adnexae: Both ovaries are normal in appearance. No mass or abnormal free fluid identified. IMPRESSION: Findings meet definitive criteria for failed pregnancy. This follows SRU consensus guidelines: Diagnostic Criteria for Nonviable Pregnancy Early in the First Trimester. Macy Mis J Med [id number]. Electronically Signed   By: Danae Orleans M.D.   On: 08/01/2020 17:44    MAU Course: Orders Placed This Encounter  Procedures   Wet prep, genital   US OB Transvaginal   Discharge patient   Meds ordered this encounter  Medications   oxyCODONE-acetaminophen (PERCOCET/ROXICET) 5-325 MG per tablet 1 tablet   oxyCODONE-acetaminophen (PERCOCET/ROXICET) 5-325 MG tablet    Sig: Take 1-2 tablets by mouth every 4 (four) hours as needed for severe pain.    Dispense:  10 tablet    Refill:  0    Order Specific Question:   Supervising Provider    Answer:   Samara Snide    MDM: Wet prep negative, GC/CT pending [name] with results as above. Results definitive for failed pregnancy.  1 Percocet ordered while here, rx sent to pharmacy I discussed options with patient including expectant management, management with cytotec given buccally or vaginally, or scheduling a D&C. Risks/benefits of each option discussed. Patient elects for expectant management at this time.   Assessment: 1. SAB (spontaneous abortion)      Plan: Discharge home in stable condition.  Reviewed strict bleeding and return precautions RX for percocet prn for pain sent to pharmacy Patient to follow up in 1 week at [facility], message sent Return to MAU as needed    Follow-up Information     [facility] Follow up.   Specialty: Obstetrics and  Gynecology Why: in 1 week. Someone will contact you. Return to MAU as needed Contact information: [address] [place] [address] [phone]  Allergies as of 08/01/2020   No Known Allergies      Medication List     TAKE these medications    oxyCODONE-acetaminophen 5-325 MG tablet Commonly known as: PERCOCET/ROXICET Take 1-2 tablets by mouth every 4 (four) hours as needed for severe pain.   Prenatal Plus Vitamin/Mineral 27-1 MG Tabs Take 1 tablet by mouth daily.         Camelia Eng, MSN, CNM 08/01/2020 7:06 PM

## 2020-08-01 NOTE — MAU Note (Signed)
Sue Clayton is a 35 y.o. at [redacted]w[redacted]d here in MAU reporting: bleeding for a few days, states it was light but now the bleeding is darker and she is having some small clots. Is not having to wear a pad. Started having abdominal cramping this morning.  Onset of complaint: ongoing  Pain score: 9/10  Vitals:   08/01/20 1603  BP: 107/82  Pulse: 96  Resp: 20  Temp: 98.4 F (36.9 C)  SpO2: 100%     Lab orders placed from triage: none

## 2020-08-03 LAB — GC/CHLAMYDIA PROBE AMP (~~LOC~~) NOT AT ARMC
Chlamydia: NEGATIVE
Comment: NEGATIVE
Comment: NORMAL
Neisseria Gonorrhea: NEGATIVE

## 2020-09-01 ENCOUNTER — Other Ambulatory Visit: Payer: Self-pay

## 2020-09-01 ENCOUNTER — Ambulatory Visit
Admission: EM | Admit: 2020-09-01 | Discharge: 2020-09-01 | Disposition: A | Discharge status: Home / Self Care | Payer: Medicaid Other | Attending: Emergency Medicine | Admitting: Emergency Medicine

## 2020-09-01 DIAGNOSIS — B9689 Other specified bacterial agents as the cause of diseases classified elsewhere: Secondary | ICD-10-CM | POA: Diagnosis not present

## 2020-09-01 DIAGNOSIS — N760 Acute vaginitis: Secondary | ICD-10-CM

## 2020-09-01 LAB — POCT URINALYSIS DIP (MANUAL ENTRY)
Bilirubin, UA: NEGATIVE
Blood, UA: NEGATIVE
Glucose, UA: NEGATIVE mg/dL
Ketones, POC UA: NEGATIVE mg/dL
Leukocytes, UA: NEGATIVE
Nitrite, UA: NEGATIVE
Protein Ur, POC: NEGATIVE mg/dL
Spec Grav, UA: 1.025 (ref 1.010–1.025)
Urobilinogen, UA: 0.2 E.U./dL
pH, UA: 6 (ref 5.0–8.0)

## 2020-09-01 MED ORDER — METRONIDAZOLE 500 MG PO TABS: 500.0000 mg | ORAL_TABLET | Freq: Two times a day (BID) | ORAL | 0 refills | Status: AC

## 2020-09-01 NOTE — ED Provider Notes (Signed)
HPI  SUBJECTIVE:  Sue Clayton is a 35 y.o. female who presents with persistent fishy vaginal odor since having a miscarriage on 7/7.  She also reports some dysuria.  No other urinary complaints.  She states that the vaginal bleeding has stopped.  She has not been sexually active since the miscarriage.  She denies nausea, vomiting, fevers, abdominal, back, pelvic pain, body aches, vaginal itching or rash.  She tried douching without improvement in her symptoms.  No aggravating factors.  Patient had BV on 08/01/2020.  Gonorrhea, chlamydia testing negative.  She has a past medical history of BV.  No history of yeast, STDs.  PMD: None.  Past Medical History:  Diagnosis Date   Anxiety    Depression    Gall stones    Infection    UTI   Kidney stones    Mental disorder    Vaginal Pap smear, abnormal    colpo- ok since    Past Surgical History:  Procedure Laterality Date   CESAREAN SECTION     LITHOTRIPSY      Family History  Problem Relation Age of Onset   Hypertension Mother    Diabetes Mother     Social History   Tobacco Use   Smoking status: Every Day    Packs/day: 0.50    Years: 19.00    Pack years: 9.50    Types: Cigarettes   Smokeless tobacco: Never  Vaping Use   Vaping Use: Never used  Substance Use Topics   Alcohol use: Yes    Comment: drinking every day due to stress   Drug use: Yes    Frequency: 2.0 times per week    Types: Marijuana    No current facility-administered medications for this encounter.  Current Outpatient Medications:    metroNIDAZOLE (FLAGYL) 500 MG tablet, Take 1 tablet (500 mg total) by mouth 2 (two) times daily for 7 days., Disp: 14 tablet, Rfl: 0   Prenatal Vit-Fe Fumarate-FA (PRENATAL PLUS VITAMIN/MINERAL) 27-1 MG TABS, Take 1 tablet by mouth daily., Disp: 30 tablet, Rfl: 11  No Known Allergies   ROS  As noted in HPI.   Physical Exam  BP 133/87 (BP Location: Right Arm)   Pulse 72   Temp 97.7 F (36.5 C) (Oral)    Resp 18   LMP 04/30/2020   SpO2 100%   Breastfeeding No   Constitutional: Well developed, well nourished, no acute distress Eyes:  EOMI, conjunctiva normal bilaterally HENT: Normocephalic, atraumatic,mucus membranes moist Respiratory: Normal inspiratory effort Cardiovascular: Normal rate GI: nondistended, soft.  No suprapubic, flank tenderness Back: No CVAT skin: No rash, skin intact Musculoskeletal: no deformities Neurologic: Alert & oriented x 3, no focal neuro deficits Psychiatric: Speech and behavior appropriate   ED Course   Medications - No data to display  Orders Placed This Encounter  Procedures   POCT urinalysis dipstick    Standing Status:   Standing    Number of Occurrences:   1    Results for orders placed or performed during the hospital encounter of 09/01/20 (from the past 24 hour(s))  POCT urinalysis dipstick     Status: None   Collection Time: 09/01/20  3:21 PM  Result Value Ref Range   Color, UA yellow yellow   Clarity, UA clear clear   Glucose, UA negative negative mg/dL   Bilirubin, UA negative negative   Ketones, POC UA negative negative mg/dL   Spec Grav, UA 3.810 1.751 - 1.025   Blood, UA negative  negative   pH, UA 6.0 5.0 - 8.0   Protein Ur, POC negative negative mg/dL   Urobilinogen, UA 0.2 0.2 or 1.0 E.U./dL   Nitrite, UA Negative Negative   Leukocytes, UA Negative Negative   No results found.  ED Clinical Impression  1. BV (bacterial vaginosis)      ED Assessment/Plan  Previous labs, records reviewed.  UA negative for UTI.  Suspect BV.  Sent swab for BV and yeast.  Not repeating STD testing as her most recent testing was negative and she has not been sexually active since.  Doubt endometritis in the absence of abdominal tenderness, fevers or retained products of conception with a resolution of bleeding.  Will send home with Flagyl.  Advised patient to  follow-up with women's health care center as originally planned, and also because  she states that she is currently dealing with some depression.   Discussed labs, MDM, treatment plan, and plan for follow-up with patient. patient agrees with plan.   Meds ordered this encounter  Medications   metroNIDAZOLE (FLAGYL) 500 MG tablet    Sig: Take 1 tablet (500 mg total) by mouth 2 (two) times daily for 7 days.    Dispense:  14 tablet    Refill:  0      *This clinic note was created using Scientist, clinical (histocompatibility and immunogenetics). Therefore, there may be occasional mistakes despite careful proofreading.  ?    Domenick Gong, MD 09/02/20 [phone]

## 2020-09-01 NOTE — ED Triage Notes (Signed)
Pt miscarried at the beginning of the month and notes after the residual bleeding stopped she started having a vaginal odor. Pt approximates the odor starting about 1 week ago. Has tried douching without relief. No sexual activity since the miscarriage. Pt was advised to passed the fetus at home by a provider at women's. Pt was 65mo pregnant, but was told the fetus hadn't grown in 5 weeks at the time she begin to miscarry. She also states that it took 5 days to pass the fetus. No new vaginal discharge. Confirms vaginal irritation and dysuria. Denies urinary frequency.

## 2020-09-01 NOTE — Discharge Instructions (Addendum)
I suspect that this is BV.  Finish the Flagyl, even if you feel better.  No intercourse until your labs have returned, your symptoms have resolved and you have finished all of the medication.  Please follow-up with [facility], especially if you are dealing with depression

## 2020-10-15 ENCOUNTER — Other Ambulatory Visit: Payer: Self-pay | Admitting: Internal Medicine

## 2020-10-16 LAB — CBC
HCT: 39.8 % (ref 35.0–45.0)
Hemoglobin: 13.2 g/dL (ref 11.7–15.5)
MCH: 28.1 pg (ref 27.0–33.0)
MCHC: 33.2 g/dL (ref 32.0–36.0)
MCV: 84.7 fL (ref 80.0–100.0)
MPV: 11.2 fL (ref 7.5–12.5)
Platelets: 293 10*3/uL (ref 140–400)
RBC: 4.7 10*6/uL (ref 3.80–5.10)
RDW: 13.4 % (ref 11.0–15.0)
WBC: 6.5 10*3/uL (ref 3.8–10.8)

## 2020-10-16 LAB — COMPLETE METABOLIC PANEL WITH GFR
AG Ratio: 1.4 (calc) (ref 1.0–2.5)
ALT: 24 U/L (ref 6–29)
AST: 24 U/L (ref 10–30)
Albumin: 4 g/dL (ref 3.6–5.1)
Alkaline phosphatase (APISO): 68 U/L (ref 31–125)
BUN: 15 mg/dL (ref 7–25)
CO2: 22 mmol/L (ref 20–32)
Calcium: 9 mg/dL (ref 8.6–10.2)
Chloride: 104 mmol/L (ref 98–110)
Creat: 0.66 mg/dL (ref 0.50–0.97)
Globulin: 2.9 g/dL (calc) (ref 1.9–3.7)
Glucose, Bld: 85 mg/dL (ref 65–99)
Potassium: 4.2 mmol/L (ref 3.5–5.3)
Sodium: 136 mmol/L (ref 135–146)
Total Bilirubin: 0.3 mg/dL (ref 0.2–1.2)
Total Protein: 6.9 g/dL (ref 6.1–8.1)
eGFR: 117 mL/min/{1.73_m2} (ref >=60)

## 2020-10-16 LAB — LIPID PANEL
Cholesterol: 125 mg/dL (ref <200)
HDL: 62 mg/dL (ref >=50)
LDL Cholesterol (Calc): 42 mg/dL (calc)
Non-HDL Cholesterol (Calc): 63 mg/dL (calc) (ref <130)
Total CHOL/HDL Ratio: 2 (calc) (ref <5.0)
Triglycerides: 131 mg/dL (ref <150)

## 2020-10-16 LAB — VITAMIN D 25 HYDROXY (VIT D DEFICIENCY, FRACTURES): Vit D, 25-Hydroxy: 11 ng/mL — ABNORMAL LOW (ref 30–100)

## 2020-10-16 LAB — TSH: TSH: 1.92 mIU/L

## 2021-06-04 ENCOUNTER — Encounter: Payer: Self-pay | Admitting: Emergency Medicine

## 2021-06-04 ENCOUNTER — Ambulatory Visit
Admission: EM | Admit: 2021-06-04 | Discharge: 2021-06-04 | Disposition: A | Discharge status: Home / Self Care | Payer: Medicaid Other | Attending: Internal Medicine | Admitting: Internal Medicine

## 2021-06-04 DIAGNOSIS — R300 Dysuria: Secondary | ICD-10-CM | POA: Diagnosis not present

## 2021-06-04 DIAGNOSIS — Z113 Encounter for screening for infections with a predominantly sexual mode of transmission: Secondary | ICD-10-CM

## 2021-06-04 DIAGNOSIS — N898 Other specified noninflammatory disorders of vagina: Secondary | ICD-10-CM | POA: Diagnosis not present

## 2021-06-04 LAB — POCT URINALYSIS DIP (MANUAL ENTRY)
Bilirubin, UA: NEGATIVE
Blood, UA: NEGATIVE
Glucose, UA: NEGATIVE mg/dL
Ketones, POC UA: NEGATIVE mg/dL
Leukocytes, UA: NEGATIVE
Nitrite, UA: NEGATIVE
Protein Ur, POC: NEGATIVE mg/dL
Spec Grav, UA: 1.025 (ref 1.010–1.025)
Urobilinogen, UA: 0.2 E.U./dL
pH, UA: 6 (ref 5.0–8.0)

## 2021-06-04 LAB — POCT URINE PREGNANCY: Preg Test, Ur: NEGATIVE

## 2021-06-04 NOTE — ED Triage Notes (Signed)
Patient c/o possible UTI, low back pain, urinary urgency x 5 days.  Patient would like STI testing and bloodwork.  No hematuria.  Denies any OTC meds.

## 2021-06-04 NOTE — Discharge Instructions (Signed)
Your urine did not show signs of urinary tract infection.  Urine culture and STD testing are pending.  We will call if they are positive.  Please refrain from sexual activity until test results and treatment are complete.

## 2021-06-04 NOTE — ED Provider Notes (Signed)
[name]    CSN: [id number] Arrival date & time: 06/04/21  0854      History   Chief Complaint Chief Complaint  Patient presents with   Abdominal Pain    HPI Sue Clayton is a 36 y.o. female.   Patient presents today with 5-day history of urinary urgency, left lower back pain, left lower abdominal pain, vaginal discharge.  Patient is concerned for STD given that she has a new sexual partner.  Vaginal discharge is white to brown in color.  Denies dysuria, hematuria, abnormal vaginal bleeding, pelvic pain, fever.  Patient would like blood work for HIV and syphilis as well.  Denies any confirmed exposure to STD.  Patient has not taken any medications for symptoms.   Abdominal Pain  Past Medical History:  Diagnosis Date   Anxiety    Depression    Gall stones    Infection    UTI   Kidney stones    Mental disorder    Vaginal Pap smear, abnormal    colpo- ok since    Patient Active Problem List   Diagnosis Date Noted   Smoker 01/04/2014   Pregnancy 01/04/2014   Anxiety state, unspecified 08/19/2013   Vaginal candidiasis 08/19/2013   Cellulitis, neck 08/17/2013    Past Surgical History:  Procedure Laterality Date   CESAREAN SECTION     LITHOTRIPSY      OB History     Gravida  10   Para  4   Term  4   Preterm      AB  5   Living  4      SAB      IAB  5   Ectopic      Multiple      Live Births  4            Home Medications    Prior to Admission medications   Medication Sig Start Date End Date Taking? Authorizing Provider  Prenatal Vit-Fe Fumarate-FA (PRENATAL PLUS VITAMIN/MINERAL) 27-1 MG TABS Take 1 tablet by mouth daily. 06/25/20   Marny LowensteinWenzel, Julie N, PA-C    Family History Family History  Problem Relation Age of Onset   Hypertension Mother    Diabetes Mother     Social History Social History   Tobacco Use   Smoking status: Every Day    Packs/day: 0.50    Years: 19.00    Pack years: 9.50    Types:  Cigarettes   Smokeless tobacco: Never  Vaping Use   Vaping Use: Never used  Substance Use Topics   Alcohol use: Yes    Comment: drinking every day due to stress   Drug use: Yes    Frequency: 2.0 times per week    Types: Marijuana     Allergies   Patient has no known allergies.   Review of Systems Review of Systems Per HPI  Physical Exam Triage Vital Signs ED Triage Vitals  Enc Vitals Group     BP 06/04/21 0932 (!) 137/94     Pulse Rate 06/04/21 0932 69     Resp 06/04/21 0932 18     Temp 06/04/21 0932 98.2 F (36.8 C)     Temp Source 06/04/21 0932 Oral     SpO2 06/04/21 0932 98 %     Weight 06/04/21 0933 265 lb (120.2 kg)     Height 06/04/21 0933 5\' 7"  (1.702 m)     Head Circumference --  Peak Flow --      Pain Score 06/04/21 0933 8     Pain Loc --      Pain Edu? --      Excl. in GC? --    No data found.  Updated Vital Signs BP (!) 137/94 (BP Location: Left Arm)   Pulse 69   Temp 98.2 F (36.8 C) (Oral)   Resp 18   Ht 5\' 7"  (1.702 m)   Wt 265 lb (120.2 kg)   LMP 04/16/2021   SpO2 98%   BMI 41.50 kg/m   Visual Acuity Right Eye Distance:   Left Eye Distance:   Bilateral Distance:    Right Eye Near:   Left Eye Near:    Bilateral Near:     Physical Exam Constitutional:      General: She is not in acute distress.    Appearance: Normal appearance. She is not toxic-appearing or diaphoretic.  HENT:     Head: Normocephalic and atraumatic.  Eyes:     Extraocular Movements: Extraocular movements intact.     Conjunctiva/sclera: Conjunctivae normal.  Cardiovascular:     Rate and Rhythm: Normal rate and regular rhythm.     Pulses: Normal pulses.     Heart sounds: Normal heart sounds.  Pulmonary:     Effort: Pulmonary effort is normal. No respiratory distress.     Breath sounds: Normal breath sounds.  Abdominal:     General: Bowel sounds are normal. There is no distension.     Palpations: Abdomen is soft.     Tenderness: There is no abdominal  tenderness.  Genitourinary:    Comments: Deferred with shared decision making.  Self swab performed. Neurological:     General: No focal deficit present.     Mental Status: She is alert and oriented to person, place, and time. Mental status is at baseline.  Psychiatric:        Mood and Affect: Mood normal.        Behavior: Behavior normal.        Thought Content: Thought content normal.        Judgment: Judgment normal.     UC Treatments / Results  Labs (all labs ordered are listed, but only abnormal results are displayed) Labs Reviewed  URINE CULTURE  RPR  HIV ANTIBODY (ROUTINE TESTING W REFLEX)  POCT URINALYSIS DIP (MANUAL ENTRY)  POCT URINE PREGNANCY  CERVICOVAGINAL ANCILLARY ONLY    EKG   Radiology No results found.  Procedures Procedures (including critical care time)  Medications Ordered in UC Medications - No data to display  Initial Impression / Assessment and Plan / UC Course  I have reviewed the triage vital signs and the nursing notes.  Pertinent labs & imaging results that were available during my care of the patient were reviewed by me and considered in my medical decision making (see chart for details).     Urinalysis was negative for any UTI.  Highly suspicious of vaginitis and possible STD given new sexual partner.  Cervicovaginal swab pending.  Will await results for any treatment.  Urine culture also pending.  Patient advised to refrain from sexual activity until test results and treatment are complete.  Patient verbalized understanding and was agreeable with plan. Final Clinical Impressions(s) / UC Diagnoses   Final diagnoses:  Vaginal discharge  Dysuria  Screening examination for venereal disease     Discharge Instructions      Your urine did not show signs of urinary tract infection.  Urine culture and STD testing are pending.  We will call if they are positive.  Please refrain from sexual activity until test results and treatment are  complete.    ED Prescriptions   None    PDMP not reviewed this encounter.   Gustavus Bryant, [place] 06/04/21 1003

## 2021-06-05 LAB — HIV ANTIBODY (ROUTINE TESTING W REFLEX): HIV Screen 4th Generation wRfx: NONREACTIVE

## 2021-06-05 LAB — URINE CULTURE: Culture: <10000 — AB

## 2021-06-05 LAB — RPR: RPR Ser Ql: NONREACTIVE

## 2021-06-07 LAB — CERVICOVAGINAL ANCILLARY ONLY
Bacterial Vaginitis (gardnerella): POSITIVE — AB
Candida Glabrata: NEGATIVE
Candida Vaginitis: POSITIVE — AB
Chlamydia: NEGATIVE
Comment: NEGATIVE
Comment: NEGATIVE
Comment: NEGATIVE
Comment: NEGATIVE
Comment: NEGATIVE
Comment: NORMAL
Neisseria Gonorrhea: NEGATIVE
Trichomonas: NEGATIVE

## 2021-06-08 ENCOUNTER — Telehealth: Payer: Self-pay

## 2021-06-08 MED ORDER — FLUCONAZOLE 150 MG PO TABS: 150.0000 mg | ORAL_TABLET | Freq: Every day | ORAL | 0 refills | Status: DC

## 2021-06-08 MED ORDER — METRONIDAZOLE 500 MG PO TABS: 500.0000 mg | ORAL_TABLET | Freq: Two times a day (BID) | ORAL | 0 refills | Status: DC

## 2021-07-21 ENCOUNTER — Ambulatory Visit
Admission: EM | Admit: 2021-07-21 | Discharge: 2021-07-21 | Disposition: A | Discharge status: Home / Self Care | Payer: Medicaid Other | Attending: Internal Medicine | Admitting: Internal Medicine

## 2021-07-21 DIAGNOSIS — Z113 Encounter for screening for infections with a predominantly sexual mode of transmission: Secondary | ICD-10-CM | POA: Insufficient documentation

## 2021-07-21 DIAGNOSIS — Z3202 Encounter for pregnancy test, result negative: Secondary | ICD-10-CM | POA: Insufficient documentation

## 2021-07-21 DIAGNOSIS — N898 Other specified noninflammatory disorders of vagina: Secondary | ICD-10-CM | POA: Insufficient documentation

## 2021-07-21 LAB — POCT URINE PREGNANCY: Preg Test, Ur: NEGATIVE

## 2021-07-21 MED ORDER — CLINDAMYCIN HCL 150 MG PO CAPS: 450.0000 mg | ORAL_CAPSULE | Freq: Three times a day (TID) | ORAL | 0 refills | Status: AC

## 2021-07-21 NOTE — ED Triage Notes (Signed)
Pt c/o vaginal discharge and malodor. Onset ~ 4-5 days ago. States recently dx w/ BV and tx was successful.

## 2021-07-21 NOTE — ED Provider Notes (Signed)
[name]    CSN: [id number] Arrival date & time: 07/21/21  6213      History   Chief Complaint Chief Complaint  Patient presents with   Vaginal Discharge    HPI Sue Clayton is a 36 y.o. female.   Patient presents with vaginal discharge and vaginal odor that started about 4 to 5 days ago.  Denies dysuria, urinary frequency, abdominal pain, fever, hematuria, abnormal vaginal bleeding.  Last menstrual cycle was about a week or two prior.  Although, patient states that her menstrual cycle prior to that was about 3 months ago.  She states that she typically has monthly menstrual cycles unless she "gains weight".  Denies any confirmed exposure to STD but is open to STD testing.  Patient is attributing symptoms to bacterial vaginosis as she has recurrent bacterial vaginosis.  She was last treated in June with resolution of symptoms at that time.    Vaginal Discharge   Past Medical History:  Diagnosis Date   Anxiety    Depression    Gall stones    Infection    UTI   Kidney stones    Mental disorder    Vaginal Pap smear, abnormal    colpo- ok since    Patient Active Problem List   Diagnosis Date Noted   Smoker 01/04/2014   Pregnancy 01/04/2014   Anxiety state, unspecified 08/19/2013   Vaginal candidiasis 08/19/2013   Cellulitis, neck 08/17/2013    Past Surgical History:  Procedure Laterality Date   CESAREAN SECTION     LITHOTRIPSY      OB History     Gravida  10   Para  4   Term  4   Preterm      AB  5   Living  4      SAB      IAB  5   Ectopic      Multiple      Live Births  4            Home Medications    Prior to Admission medications   Medication Sig Start Date End Date Taking? Authorizing Provider  clindamycin (CLEOCIN) 150 MG capsule Take 3 capsules (450 mg total) by mouth 3 (three) times daily for 5 days. 07/21/21 07/26/21 Yes Emaya Preston, Acie Fredrickson, FNP  Prenatal Vit-Fe Fumarate-FA (PRENATAL PLUS VITAMIN/MINERAL)  27-1 MG TABS Take 1 tablet by mouth daily. 06/25/20   Marny Lowenstein, PA-C    Family History Family History  Problem Relation Age of Onset   Hypertension Mother    Diabetes Mother     Social History Social History   Tobacco Use   Smoking status: Every Day    Packs/day: 0.50    Years: 19.00    Total pack years: 9.50    Types: Cigarettes   Smokeless tobacco: Never  Vaping Use   Vaping Use: Never used  Substance Use Topics   Alcohol use: Yes    Comment: drinking every day due to stress   Drug use: Yes    Frequency: 2.0 times per week    Types: Marijuana     Allergies   Patient has no known allergies.   Review of Systems Review of Systems Per HPI  Physical Exam Triage Vital Signs ED Triage Vitals [07/21/21 0833]  Enc Vitals Group     BP 120/85     Pulse Rate 70     Resp 18     Temp 98 F (  36.7 C)     Temp Source Oral     SpO2 98 %     Weight      Height      Head Circumference      Peak Flow      Pain Score 0     Pain Loc      Pain Edu?      Excl. in GC?    No data found.  Updated Vital Signs BP 120/85 (BP Location: Left Arm)   Pulse 70   Temp 98 F (36.7 C) (Oral)   Resp 18   SpO2 98%   Visual Acuity Right Eye Distance:   Left Eye Distance:   Bilateral Distance:    Right Eye Near:   Left Eye Near:    Bilateral Near:     Physical Exam Constitutional:      General: She is not in acute distress.    Appearance: Normal appearance. She is not toxic-appearing or diaphoretic.  HENT:     Head: Normocephalic and atraumatic.  Eyes:     Extraocular Movements: Extraocular movements intact.     Conjunctiva/sclera: Conjunctivae normal.  Cardiovascular:     Rate and Rhythm: Normal rate and regular rhythm.     Pulses: Normal pulses.     Heart sounds: Normal heart sounds.  Pulmonary:     Effort: Pulmonary effort is normal. No respiratory distress.     Breath sounds: Normal breath sounds.  Abdominal:     General: Bowel sounds are normal. There  is no distension.     Palpations: Abdomen is soft.     Tenderness: There is no abdominal tenderness.  Genitourinary:    Comments: Deferred with shared decision-making.  Self swab performed. Neurological:     General: No focal deficit present.     Mental Status: She is alert and oriented to person, place, and time. Mental status is at baseline.  Psychiatric:        Mood and Affect: Mood normal.        Behavior: Behavior normal.        Thought Content: Thought content normal.        Judgment: Judgment normal.      UC Treatments / Results  Labs (all labs ordered are listed, but only abnormal results are displayed) Labs Reviewed  POCT URINE PREGNANCY  CERVICOVAGINAL ANCILLARY ONLY    EKG   Radiology No results found.  Procedures Procedures (including critical care time)  Medications Ordered in UC Medications - No data to display  Initial Impression / Assessment and Plan / UC Course  I have reviewed the triage vital signs and the nursing notes.  Pertinent labs & imaging results that were available during my care of the patient were reviewed by me and considered in my medical decision making (see chart for details).     Highly suspicious of bacterial vaginosis given patient's history of recurrence.  Will treat with clindamycin given recent treatment with metronidazole.  Patient advised to follow-up with provided contact information for gynecology for further evaluation and management given recurrent bacterial vaginosis as well.  Cervicovaginal swab pending.  Urine pregnancy was negative.  Will await any further results for treatment.  Patient advised to refrain from sexual activity until test results and treatment are complete.  Discussed return precautions.  Patient verbalized understanding and was agreeable with plan. Final Clinical Impressions(s) / UC Diagnoses   Final diagnoses:  Vaginal discharge  Screening examination for venereal disease  Pregnancy test  negative      Discharge Instructions      I am suspicious that you have bacterial vaginosis so you are being treated with clindamycin.  Your vaginal swab is pending.  We will call if results are abnormal.  Please follow-up with gynecology at provided contact information given recurrent bacterial vaginosis.  Please refrain from sexual activity until test results and treatment are complete.  Your pregnancy test was negative.    ED Prescriptions     Medication Sig Dispense Auth. Provider   clindamycin (CLEOCIN) 150 MG capsule Take 3 capsules (450 mg total) by mouth 3 (three) times daily for 5 days. 45 capsule [place], Acie Fredrickson, [place]      PDMP not reviewed this encounter.   Gustavus Bryant, [place] 07/21/21 [phone]

## 2021-07-21 NOTE — Discharge Instructions (Addendum)
I am suspicious that you have bacterial vaginosis so you are being treated with clindamycin.  Your vaginal swab is pending.  We will call if results are abnormal.  Please follow-up with gynecology at provided contact information given recurrent bacterial vaginosis.  Please refrain from sexual activity until test results and treatment are complete.  Your pregnancy test was negative.

## 2021-07-22 ENCOUNTER — Telehealth: Payer: Self-pay | Admitting: Internal Medicine

## 2021-07-22 ENCOUNTER — Telehealth (HOSPITAL_COMMUNITY): Payer: Self-pay | Admitting: Emergency Medicine

## 2021-07-22 LAB — CERVICOVAGINAL ANCILLARY ONLY
Bacterial Vaginitis (gardnerella): POSITIVE — AB
Candida Glabrata: NEGATIVE
Candida Vaginitis: NEGATIVE
Chlamydia: NEGATIVE
Comment: NEGATIVE
Comment: NEGATIVE
Comment: NEGATIVE
Comment: NEGATIVE
Comment: NEGATIVE
Comment: NORMAL
Neisseria Gonorrhea: NEGATIVE
Trichomonas: POSITIVE — AB

## 2021-07-22 MED ORDER — METRONIDAZOLE 500 MG PO TABS: 500.0000 mg | ORAL_TABLET | Freq: Two times a day (BID) | ORAL | 0 refills | Status: DC

## 2021-07-22 NOTE — Telephone Encounter (Signed)
Patient called inquiring if she could take both clindamycin and Flagyl.  Patient tested positive for trichomoniasis and BV on recent cervicovaginal swab.  Advised patient that Flagyl should cover for both BV and trich and that she may stop taking clindamycin given that taking both medications could cause vaginal yeast infection as well as adverse stomach upset.  Patient stated that she was stop taking clindamycin and will take Flagyl.  Advised patient to follow-up if symptoms persist or worsen.  Patient verbalized understanding and was agreeable with plan.  All questions answered.

## 2021-09-28 ENCOUNTER — Ambulatory Visit
Admission: EM | Admit: 2021-09-28 | Discharge: 2021-09-28 | Disposition: A | Discharge status: Home / Self Care | Payer: Medicaid Other | Attending: Physician Assistant | Admitting: Physician Assistant

## 2021-09-28 DIAGNOSIS — N898 Other specified noninflammatory disorders of vagina: Secondary | ICD-10-CM | POA: Diagnosis not present

## 2021-09-28 DIAGNOSIS — R102 Pelvic and perineal pain: Secondary | ICD-10-CM | POA: Diagnosis not present

## 2021-09-28 LAB — POCT URINALYSIS DIP (MANUAL ENTRY)
Bilirubin, UA: NEGATIVE
Blood, UA: NEGATIVE
Glucose, UA: NEGATIVE mg/dL
Ketones, POC UA: NEGATIVE mg/dL
Leukocytes, UA: NEGATIVE
Nitrite, UA: NEGATIVE
Protein Ur, POC: NEGATIVE mg/dL
Spec Grav, UA: 1.02 (ref 1.010–1.025)
Urobilinogen, UA: 0.2 E.U./dL
pH, UA: 6 (ref 5.0–8.0)

## 2021-09-28 LAB — POCT URINE PREGNANCY: Preg Test, Ur: NEGATIVE

## 2021-09-28 NOTE — ED Triage Notes (Signed)
Pt presents to uc with co of dysuria, lower abd pain, vaginal discharge, and vaginal irritation. Pt wants sti testing and preg/urinary test

## 2021-09-29 NOTE — ED Provider Notes (Signed)
[facility]    CSN: [id number] Arrival date & time: 09/28/21  1845      History   Chief Complaint Chief Complaint  Patient presents with   Vaginal Discharge    HPI Sue Clayton is a 36 y.o. female.   Patient here today for evaluation of dysuria, lower abdominal pain, vaginal discharge and vaginal irritation that started a few days ago.  She reports that she would like STD screening as well as pregnancy test.  She has not had fever.  She does not report any nausea or vomiting.    The history is provided by the patient.  Vaginal Discharge Associated symptoms: abdominal pain and dysuria   Associated symptoms: no fever, no nausea and no vomiting     Past Medical History:  Diagnosis Date   Anxiety    Depression    Gall stones    Infection    UTI   Kidney stones    Mental disorder    Vaginal Pap smear, abnormal    colpo- ok since    Patient Active Problem List   Diagnosis Date Noted   Smoker 01/04/2014   Pregnancy 01/04/2014   Anxiety state, unspecified 08/19/2013   Vaginal candidiasis 08/19/2013   Cellulitis, neck 08/17/2013    Past Surgical History:  Procedure Laterality Date   CESAREAN SECTION     LITHOTRIPSY      OB History     Gravida  10   Para  4   Term  4   Preterm      AB  5   Living  4      SAB      IAB  5   Ectopic      Multiple      Live Births  4            Home Medications    Prior to Admission medications   Medication Sig Start Date End Date Taking? Authorizing Provider  metroNIDAZOLE (FLAGYL) 500 MG tablet Take 1 tablet (500 mg total) by mouth 2 (two) times daily. 07/22/21   Lamptey, Britta Mccreedy, MD  Prenatal Vit-Fe Fumarate-FA (PRENATAL PLUS VITAMIN/MINERAL) 27-1 MG TABS Take 1 tablet by mouth daily. 06/25/20   Marny Lowenstein, PA-C    Family History Family History  Problem Relation Age of Onset   Hypertension Mother    Diabetes Mother     Social History Social History   Tobacco Use    Smoking status: Every Day    Packs/day: 0.50    Years: 19.00    Total pack years: 9.50    Types: Cigarettes   Smokeless tobacco: Never  Vaping Use   Vaping Use: Never used  Substance Use Topics   Alcohol use: Yes    Comment: drinking every day due to stress   Drug use: Yes    Frequency: 2.0 times per week    Types: Marijuana     Allergies   Patient has no known allergies.   Review of Systems Review of Systems  Constitutional:  Negative for chills and fever.  Eyes:  Negative for discharge and redness.  Gastrointestinal:  Positive for abdominal pain. Negative for nausea and vomiting.  Genitourinary:  Positive for dysuria and vaginal discharge. Negative for vaginal bleeding.     Physical Exam Triage Vital Signs ED Triage Vitals [09/28/21 1933]  Enc Vitals Group     BP (!) 141/79     Pulse Rate 79     Resp 18  Temp 98 F (36.7 C)     Temp src      SpO2 98 %     Weight      Height      Head Circumference      Peak Flow      Pain Score      Pain Loc      Pain Edu?      Excl. in [place]?    No data found.  Updated Vital Signs BP (!) 141/79   Pulse 79   Temp 98 F (36.7 C)   Resp 18   LMP 09/28/2021   SpO2 98%      Physical Exam Vitals and nursing note reviewed.  Constitutional:      General: She is not in acute distress.    Appearance: Normal appearance. She is not ill-appearing.  HENT:     Head: Normocephalic and atraumatic.  Eyes:     Conjunctiva/sclera: Conjunctivae normal.  Cardiovascular:     Rate and Rhythm: Normal rate.  Pulmonary:     Effort: Pulmonary effort is normal.  Neurological:     Mental Status: She is alert.  Psychiatric:        Mood and Affect: Mood normal.        Behavior: Behavior normal.        Thought Content: Thought content normal.      UC Treatments / Results  Labs (all labs ordered are listed, but only abnormal results are displayed) Labs Reviewed  POCT URINALYSIS DIP (MANUAL ENTRY)  POCT URINE PREGNANCY   CERVICOVAGINAL ANCILLARY ONLY    EKG   Radiology No results found.  Procedures Procedures (including critical care time)  Medications Ordered in UC Medications - No data to display  Initial Impression / Assessment and Plan / UC Course  I have reviewed the triage vital signs and the nursing notes.  Pertinent labs & imaging results that were available during my care of the patient were reviewed by me and considered in my medical decision making (see chart for details).    UA without signs of UTI.  Urine pregnancy negative.  Will order STD screening as requested.  Will await results for further recommendation.  Final Clinical Impressions(s) / UC Diagnoses   Final diagnoses:  Vaginal discharge  Suprapubic pain   Discharge Instructions   None    ED Prescriptions   None    PDMP not reviewed this encounter.   Francene Finders, PA-C 09/29/21 [phone]

## 2021-09-30 ENCOUNTER — Telehealth (HOSPITAL_COMMUNITY): Payer: Self-pay | Admitting: Emergency Medicine

## 2021-09-30 LAB — CERVICOVAGINAL ANCILLARY ONLY
Bacterial Vaginitis (gardnerella): NEGATIVE
Candida Glabrata: NEGATIVE
Candida Vaginitis: POSITIVE — AB
Chlamydia: NEGATIVE
Comment: NEGATIVE
Comment: NEGATIVE
Comment: NEGATIVE
Comment: NEGATIVE
Comment: NEGATIVE
Comment: NORMAL
Neisseria Gonorrhea: NEGATIVE
Trichomonas: NEGATIVE

## 2021-09-30 MED ORDER — FLUCONAZOLE 150 MG PO TABS: 150.0000 mg | ORAL_TABLET | Freq: Once | ORAL | 0 refills | Status: AC

## 2021-10-20 ENCOUNTER — Ambulatory Visit
Admission: EM | Admit: 2021-10-20 | Discharge: 2021-10-20 | Disposition: A | Discharge status: Home / Self Care | Payer: Medicaid Other | Attending: Physician Assistant | Admitting: Physician Assistant

## 2021-10-20 DIAGNOSIS — J020 Streptococcal pharyngitis: Secondary | ICD-10-CM | POA: Diagnosis not present

## 2021-10-20 DIAGNOSIS — Z113 Encounter for screening for infections with a predominantly sexual mode of transmission: Secondary | ICD-10-CM | POA: Diagnosis present

## 2021-10-20 LAB — POCT RAPID STREP A (OFFICE): Rapid Strep A Screen: POSITIVE — AB

## 2021-10-20 MED ORDER — AMOXICILLIN 500 MG PO CAPS: 500.0000 mg | ORAL_CAPSULE | Freq: Three times a day (TID) | ORAL | 0 refills | Status: DC

## 2021-10-20 NOTE — ED Triage Notes (Signed)
Pt c/o sore throat and ear pressure for 3 days. States she is concerned for sti and wants a full screening including bloodwork.

## 2021-10-20 NOTE — ED Provider Notes (Signed)
[name]    CSN: [id number] Arrival date & time: 10/20/21  0856      History   Chief Complaint Chief Complaint  Patient presents with   Sore Throat    HPI Sue Clayton is a 36 y.o. female.   Patient here today for evaluation of sore throat that she has had for 3 days. She has had worsening sore throat since onset. She has not had a fever. She has not had cough or congestion. She would like STD screening.   The history is provided by the patient.  Sore Throat Pertinent negatives include no abdominal pain and no shortness of breath.    Past Medical History:  Diagnosis Date   Anxiety    Depression    Gall stones    Infection    UTI   Kidney stones    Mental disorder    Vaginal Pap smear, abnormal    colpo- ok since    Patient Active Problem List   Diagnosis Date Noted   Smoker 01/04/2014   Pregnancy 01/04/2014   Anxiety state, unspecified 08/19/2013   Vaginal candidiasis 08/19/2013   Cellulitis, neck 08/17/2013    Past Surgical History:  Procedure Laterality Date   CESAREAN SECTION     LITHOTRIPSY      OB History     Gravida  10   Para  4   Term  4   Preterm      AB  5   Living  4      SAB      IAB  5   Ectopic      Multiple      Live Births  4            Home Medications    Prior to Admission medications   Medication Sig Start Date End Date Taking? Authorizing Provider  amoxicillin (AMOXIL) 500 MG capsule Take 1 capsule (500 mg total) by mouth 3 (three) times daily. 10/20/21  Yes Tomi Bamberger, PA-C  metroNIDAZOLE (FLAGYL) 500 MG tablet Take 1 tablet (500 mg total) by mouth 2 (two) times daily. 07/22/21   Lamptey, Britta Mccreedy, MD  Prenatal Vit-Fe Fumarate-FA (PRENATAL PLUS VITAMIN/MINERAL) 27-1 MG TABS Take 1 tablet by mouth daily. 06/25/20   Marny Lowenstein, PA-C    Family History Family History  Problem Relation Age of Onset   Hypertension Mother    Diabetes Mother     Social History Social  History   Tobacco Use   Smoking status: Every Day    Packs/day: 0.50    Years: 19.00    Total pack years: 9.50    Types: Cigarettes   Smokeless tobacco: Never  Vaping Use   Vaping Use: Never used  Substance Use Topics   Alcohol use: Yes    Comment: drinking every day due to stress   Drug use: Yes    Frequency: 2.0 times per week    Types: Marijuana     Allergies   Patient has no known allergies.   Review of Systems Review of Systems  Constitutional:  Negative for chills and fever.  HENT:  Positive for sore throat. Negative for congestion and ear pain.   Eyes:  Negative for discharge and redness.  Respiratory:  Negative for cough, shortness of breath and wheezing.   Gastrointestinal:  Negative for abdominal pain, diarrhea, nausea and vomiting.     Physical Exam Triage Vital Signs ED Triage Vitals  Enc Vitals Group  BP 10/20/21 0905 131/86     Pulse Rate 10/20/21 0905 100     Resp 10/20/21 0905 16     Temp 10/20/21 0905 98.1 F (36.7 C)     Temp Source 10/20/21 0905 Oral     SpO2 10/20/21 0905 99 %     Weight --      Height --      Head Circumference --      Peak Flow --      Pain Score 10/20/21 0906 8     Pain Loc --      Pain Edu? --      Excl. in [place]? --    No data found.  Updated Vital Signs BP 131/86 (BP Location: Left Arm)   Pulse 100   Temp 98.1 F (36.7 C) (Oral)   Resp 16   LMP 09/28/2021   SpO2 99%     Physical Exam Vitals and nursing note reviewed.  Constitutional:      General: She is not in acute distress.    Appearance: Normal appearance. She is not ill-appearing.  HENT:     Head: Normocephalic and atraumatic.     Nose: No congestion or rhinorrhea.     Mouth/Throat:     Mouth: Mucous membranes are moist.     Pharynx: Oropharyngeal exudate and posterior oropharyngeal erythema present.  Eyes:     Conjunctiva/sclera: Conjunctivae normal.  Cardiovascular:     Rate and Rhythm: Normal rate and regular rhythm.     Heart sounds:  Normal heart sounds. No murmur heard. Pulmonary:     Effort: Pulmonary effort is normal. No respiratory distress.     Breath sounds: Normal breath sounds. No wheezing, rhonchi or rales.  Skin:    General: Skin is warm and dry.  Neurological:     Mental Status: She is alert.  Psychiatric:        Mood and Affect: Mood normal.        Thought Content: Thought content normal.      UC Treatments / Results  Labs (all labs ordered are listed, but only abnormal results are displayed) Labs Reviewed  POCT RAPID STREP A (OFFICE) - Abnormal; Notable for the following components:      Result Value   Rapid Strep A Screen Positive (*)    All other components within normal limits  HIV ANTIBODY (ROUTINE TESTING W REFLEX)  HEPATITIS PANEL, ACUTE  RPR  CERVICOVAGINAL ANCILLARY ONLY  CYTOLOGY, (ORAL, ANAL, URETHRAL) ANCILLARY ONLY    EKG   Radiology No results found.  Procedures Procedures (including critical care time)  Medications Ordered in UC Medications - No data to display  Initial Impression / Assessment and Plan / UC Course  I have reviewed the triage vital signs and the nursing notes.  Pertinent labs & imaging results that were available during my care of the patient were reviewed by me and considered in my medical decision making (see chart for details).    Amoxicillin prescribed for treatment of strep throat. STD screening ordered as requested. Encouraged follow up with any further concerns or persistent symptoms.   Final Clinical Impressions(s) / UC Diagnoses   Final diagnoses:  Screening for STD (sexually transmitted disease)  Streptococcal sore throat     Discharge Instructions       Strep test positive.   Check MyChart for other results.      ED Prescriptions     Medication Sig Dispense Auth. Provider   amoxicillin (AMOXIL) 500  MG capsule Take 1 capsule (500 mg total) by mouth 3 (three) times daily. 21 capsule Francene Finders, PA-C      PDMP not  reviewed this encounter.   Francene Finders, PA-C 10/20/21 1054

## 2021-10-20 NOTE — Discharge Instructions (Signed)
  Strep test positive.   Check MyChart for other results.

## 2021-10-21 LAB — RPR: RPR Ser Ql: NONREACTIVE

## 2021-10-21 LAB — CERVICOVAGINAL ANCILLARY ONLY
Bacterial Vaginitis (gardnerella): POSITIVE — AB
Candida Glabrata: NEGATIVE
Candida Vaginitis: NEGATIVE
Chlamydia: NEGATIVE
Comment: NEGATIVE
Comment: NEGATIVE
Comment: NEGATIVE
Comment: NEGATIVE
Comment: NEGATIVE
Comment: NORMAL
Neisseria Gonorrhea: NEGATIVE
Trichomonas: NEGATIVE

## 2021-10-21 LAB — HIV ANTIBODY (ROUTINE TESTING W REFLEX): HIV Screen 4th Generation wRfx: NONREACTIVE

## 2021-10-22 ENCOUNTER — Telehealth (HOSPITAL_COMMUNITY): Payer: Self-pay | Admitting: Emergency Medicine

## 2021-10-22 LAB — CYTOLOGY, (ORAL, ANAL, URETHRAL) ANCILLARY ONLY
Chlamydia: NEGATIVE
Comment: NEGATIVE
Comment: NEGATIVE
Comment: NORMAL
Neisseria Gonorrhea: NEGATIVE
Trichomonas: NEGATIVE

## 2021-10-22 MED ORDER — FLUCONAZOLE 150 MG PO TABS: 150.0000 mg | ORAL_TABLET | Freq: Once | ORAL | 0 refills | Status: AC

## 2021-10-22 MED ORDER — METRONIDAZOLE 500 MG PO TABS: 500.0000 mg | ORAL_TABLET | Freq: Two times a day (BID) | ORAL | 0 refills | Status: DC

## 2022-01-07 ENCOUNTER — Encounter: Payer: Self-pay | Admitting: Emergency Medicine

## 2022-01-07 ENCOUNTER — Ambulatory Visit
Admission: EM | Admit: 2022-01-07 | Discharge: 2022-01-07 | Disposition: A | Discharge status: Home / Self Care | Payer: Medicaid Other | Attending: Physician Assistant | Admitting: Physician Assistant

## 2022-01-07 DIAGNOSIS — N912 Amenorrhea, unspecified: Secondary | ICD-10-CM | POA: Insufficient documentation

## 2022-01-07 DIAGNOSIS — Z113 Encounter for screening for infections with a predominantly sexual mode of transmission: Secondary | ICD-10-CM | POA: Diagnosis present

## 2022-01-07 DIAGNOSIS — N760 Acute vaginitis: Secondary | ICD-10-CM | POA: Insufficient documentation

## 2022-01-07 DIAGNOSIS — R300 Dysuria: Secondary | ICD-10-CM | POA: Diagnosis not present

## 2022-01-07 DIAGNOSIS — R102 Pelvic and perineal pain: Secondary | ICD-10-CM | POA: Insufficient documentation

## 2022-01-07 LAB — POCT URINALYSIS DIP (MANUAL ENTRY)
Blood, UA: NEGATIVE
Glucose, UA: NEGATIVE mg/dL
Leukocytes, UA: NEGATIVE
Nitrite, UA: NEGATIVE
Protein Ur, POC: NEGATIVE mg/dL
Spec Grav, UA: 1.025 (ref 1.010–1.025)
Urobilinogen, UA: 0.2 E.U./dL
pH, UA: 6.5 (ref 5.0–8.0)

## 2022-01-07 LAB — POCT URINE PREGNANCY: Preg Test, Ur: NEGATIVE

## 2022-01-07 MED ORDER — NITROFURANTOIN MONOHYD MACRO 100 MG PO CAPS: 100.0000 mg | ORAL_CAPSULE | Freq: Two times a day (BID) | ORAL | 0 refills | Status: DC

## 2022-01-07 MED ORDER — PHENAZOPYRIDINE HCL 200 MG PO TABS: 200.0000 mg | ORAL_TABLET | Freq: Three times a day (TID) | ORAL | 0 refills | Status: DC

## 2022-01-07 MED ORDER — IBUPROFEN 600 MG PO TABS: 600.0000 mg | ORAL_TABLET | Freq: Four times a day (QID) | ORAL | 0 refills | Status: DC | PRN

## 2022-01-07 NOTE — ED Provider Notes (Signed)
[name]    CSN: [id number] Arrival date & time: 01/07/22  1522      History   Chief Complaint Chief Complaint  Patient presents with   Urinary Frequency    HPI Sue Clayton is a 37 y.o. female.   37 year old female presents with dysuria and STI screening.  Patient indicates for the past 4 days she has been having some lower abdominal pain, tenderness, discomfort and pressure.  Patient indicates that she has been having increased frequency, urgency, dysuria.  She indicates she has hesitancy when trying to urinate.  Patient indicates she has to urinate frequently but only urinates small amounts at a time.  She also indicates she has been having some lower back pain associated with her symptoms over the past couple days.  Patient denies fever, chills, nausea or vomiting. Patient also indicates that she is having some vaginal discharge over the past several days, white, thin, without odor.  Patient indicates she does not have any external vaginal itching.  She request to have STI screening to include HIV and RPR.  Patient indicates that she has been having unprotected intercourse and is concerned about having an STI. Patient indicates that she is having amenorrhea and is 2 weeks late for her period.  She indicates her last menses was 11/22/2021.  Patient is concerned about being pregnant.  Patient indicates that she has been taking phentermine to help her lose weight over the past couple months.  Patient indicates she has lost 40 pounds while on the medicine.  She does indicate that she has had amenorrhea before when she was trying to lose weight.   Urinary Frequency Associated symptoms include abdominal pain (suprapubic area).    Past Medical History:  Diagnosis Date   Anxiety    Depression    Gall stones    Infection    UTI   Kidney stones    Mental disorder    Vaginal Pap smear, abnormal    colpo- ok since    Patient Active Problem List   Diagnosis Date  Noted   Smoker 01/04/2014   Pregnancy 01/04/2014   Anxiety state, unspecified 08/19/2013   Vaginal candidiasis 08/19/2013   Cellulitis, neck 08/17/2013    Past Surgical History:  Procedure Laterality Date   CESAREAN SECTION     LITHOTRIPSY      OB History     Gravida  10   Para  4   Term  4   Preterm      AB  5   Living  4      SAB      IAB  5   Ectopic      Multiple      Live Births  4            Home Medications    Prior to Admission medications   Medication Sig Start Date End Date Taking? Authorizing Provider  ibuprofen (ADVIL) 600 MG tablet Take 1 tablet (600 mg total) by mouth every 6 (six) hours as needed. 01/07/22  Yes Nyoka Lint, PA-C  nitrofurantoin, macrocrystal-monohydrate, (MACROBID) 100 MG capsule Take 1 capsule (100 mg total) by mouth 2 (two) times daily. 01/07/22  Yes Nyoka Lint, PA-C  phenazopyridine (PYRIDIUM) 200 MG tablet Take 1 tablet (200 mg total) by mouth 3 (three) times daily. 01/07/22  Yes Nyoka Lint, PA-C  phentermine (ADIPEX-P) 37.5 MG tablet Take 37.5 mg by mouth every morning. 12/14/21  Yes [provider]  amoxicillin (AMOXIL) 500  MG capsule Take 1 capsule (500 mg total) by mouth 3 (three) times daily. 10/20/21   Francene Finders, PA-C  metroNIDAZOLE (FLAGYL) 500 MG tablet Take 1 tablet (500 mg total) by mouth 2 (two) times daily. 10/22/21   LampteyMyrene Galas, MD  Prenatal Vit-Fe Fumarate-FA (PRENATAL PLUS VITAMIN/MINERAL) 27-1 MG TABS Take 1 tablet by mouth daily. 06/25/20   Luvenia Redden, PA-C    Family History Family History  Problem Relation Age of Onset   Hypertension Mother    Diabetes Mother     Social History Social History   Tobacco Use   Smoking status: Every Day    Packs/day: 0.50    Years: 19.00    Total pack years: 9.50    Types: Cigarettes   Smokeless tobacco: Never  Vaping Use   Vaping Use: Never used  Substance Use Topics   Alcohol use: Yes    Comment: drinking every day due to  stress   Drug use: Yes    Frequency: 2.0 times per week    Types: Marijuana     Allergies   Patient has no known allergies.   Review of Systems Review of Systems  Gastrointestinal:  Positive for abdominal pain (suprapubic area).  Genitourinary:  Positive for dysuria, frequency and urgency.     Physical Exam Triage Vital Signs ED Triage Vitals  Enc Vitals Group     BP 01/07/22 1639 (!) 127/90     Pulse Rate 01/07/22 1639 84     Resp 01/07/22 1639 18     Temp 01/07/22 1639 98 F (36.7 C)     Temp Source 01/07/22 1639 Oral     SpO2 01/07/22 1639 99 %     Weight 01/07/22 1641 230 lb (104.3 kg)     Height 01/07/22 1641 5\' 7"  (1.702 m)     Head Circumference --      Peak Flow --      Pain Score 01/07/22 1641 10     Pain Loc --      Pain Edu? --      Excl. in [place]? --    No data found.  Updated Vital Signs BP (!) 127/90 (BP Location: Right Arm)   Pulse 84   Temp 98 F (36.7 C) (Oral)   Resp 18   Ht 5\' 7"  (1.702 m)   Wt 230 lb (104.3 kg)   LMP 11/22/2021   SpO2 99%   BMI 36.02 kg/m   Visual Acuity Right Eye Distance:   Left Eye Distance:   Bilateral Distance:    Right Eye Near:   Left Eye Near:    Bilateral Near:     Physical Exam Constitutional:      Appearance: Normal appearance.  Cardiovascular:     Rate and Rhythm: Normal rate and regular rhythm.     Heart sounds: Normal heart sounds.  Pulmonary:     Effort: Pulmonary effort is normal.     Breath sounds: Normal air entry. Decreased breath sounds present. No wheezing, rhonchi or rales.  Abdominal:     General: Abdomen is flat. Bowel sounds are normal.     Palpations: Abdomen is soft.     Tenderness: There is abdominal tenderness in the suprapubic area. There is no guarding or rebound.  Neurological:     Mental Status: She is alert.      UC Treatments / Results  Labs (all labs ordered are listed, but only abnormal results are displayed) Labs Reviewed  POCT  URINALYSIS DIP (MANUAL ENTRY) -  Abnormal; Notable for the following components:      Result Value   Bilirubin, UA small (*)    Ketones, POC UA moderate (40) (*)    All other components within normal limits  RPR  HIV ANTIBODY (ROUTINE TESTING W REFLEX)  POCT URINE PREGNANCY  CERVICOVAGINAL ANCILLARY ONLY    EKG   Radiology No results found.  Procedures Procedures (including critical care time)  Medications Ordered in UC Medications - No data to display  Initial Impression / Assessment and Plan / UC Course  I have reviewed the triage vital signs and the nursing notes.  Pertinent labs & imaging results that were available during my care of the patient were reviewed by me and considered in my medical decision making (see chart for details).    Plan: 1.  The screening for STI will be treated with the following: A.  Treatment may be considered depending on the results of the STI testing to include RPR and HIV. 2.  The dysuria will be treated with the following: A.  Macrobid every 12 hours to treat urinary infection. B.  Pyridium 200 mg every 6 hours to treat dysuria. 3.  The vaginitis will be treated with the following: A.  Treatment be considered depending on the results of the STI testing to include HIV and RPR. 4.  The suprapubic pain will be treated with the following: A.  Ibuprofen 600 mg every 6 hours with food to treat pain and discomfort. 5.  The amenorrhea be treated with the following: A.  Patient advised to follow-up with PCP or see her women's health specialist to address the amenorrhea.  (This could be affected by the diet medication and weight loss over the past several weeks) 6.  Follow-up PCP return to urgent care as needed. Final Clinical Impressions(s) / UC Diagnoses   Final diagnoses:  Routine screening for STI (sexually transmitted infection)  Dysuria  Amenorrhea  Suprapubic pain  Vaginitis and vulvovaginitis     Discharge Instructions      Lab testing will be completed in 48  hours.  If you do not get a call from this office that indicates the labs are negative.  Log onto MyChart to view the lab results when they post in 48 hours.  Advised to take the Macrobid every 12 hours to treat urinary infection. Advised take Pyridium 1 every 6 hours to decrease the burning, pain and discomfort.  (This will turn your urine a different color while you are on this medicine) Advised take ibuprofen 600 mg every 6 hours with food on a regular basis to help control pain.  Advised to follow-up PCP or report to the emergency room if symptoms fail to improve or worsen.    ED Prescriptions     Medication Sig Dispense Auth. Provider   nitrofurantoin, macrocrystal-monohydrate, (MACROBID) 100 MG capsule Take 1 capsule (100 mg total) by mouth 2 (two) times daily. 10 capsule Nyoka Lint, PA-C   phenazopyridine (PYRIDIUM) 200 MG tablet Take 1 tablet (200 mg total) by mouth 3 (three) times daily. 6 tablet Nyoka Lint, PA-C   ibuprofen (ADVIL) 600 MG tablet Take 1 tablet (600 mg total) by mouth every 6 (six) hours as needed. 30 tablet Nyoka Lint, PA-C      PDMP not reviewed this encounter.   Nyoka Lint, PA-C 01/07/22 1707

## 2022-01-07 NOTE — Discharge Instructions (Signed)
Lab testing will be completed in 48 hours.  If you do not get a call from this office that indicates the labs are negative.  Log onto MyChart to view the lab results when they post in 48 hours.  Advised to take the Macrobid every 12 hours to treat urinary infection. Advised take Pyridium 1 every 6 hours to decrease the burning, pain and discomfort.  (This will turn your urine a different color while you are on this medicine) Advised take ibuprofen 600 mg every 6 hours with food on a regular basis to help control pain.  Advised to follow-up PCP or report to the emergency room if symptoms fail to improve or worsen.

## 2022-01-07 NOTE — ED Triage Notes (Signed)
Patient requesting pregnancy testing and STI swab.  LMP: 11/22/2021

## 2022-01-07 NOTE — ED Triage Notes (Signed)
Patient c/o low back pain, urinary urgency and frequency, denies hematuria, some dysuria x 3-4 days.

## 2022-01-08 LAB — RPR: RPR Ser Ql: NONREACTIVE

## 2022-01-08 LAB — HIV ANTIBODY (ROUTINE TESTING W REFLEX): HIV Screen 4th Generation wRfx: NONREACTIVE

## 2022-01-10 ENCOUNTER — Telehealth (HOSPITAL_COMMUNITY): Payer: Self-pay | Admitting: Emergency Medicine

## 2022-01-10 LAB — CERVICOVAGINAL ANCILLARY ONLY
Bacterial Vaginitis (gardnerella): POSITIVE — AB
Candida Glabrata: NEGATIVE
Candida Vaginitis: NEGATIVE
Chlamydia: NEGATIVE
Comment: NEGATIVE
Comment: NEGATIVE
Comment: NEGATIVE
Comment: NEGATIVE
Comment: NEGATIVE
Comment: NORMAL
Neisseria Gonorrhea: NEGATIVE
Trichomonas: NEGATIVE

## 2022-01-10 MED ORDER — METRONIDAZOLE 500 MG PO TABS: 500.0000 mg | ORAL_TABLET | Freq: Two times a day (BID) | ORAL | 0 refills | Status: DC

## 2022-02-23 ENCOUNTER — Ambulatory Visit: Payer: Medicaid Other | Admitting: Obstetrics and Gynecology

## 2022-02-24 ENCOUNTER — Ambulatory Visit: Payer: Medicaid Other | Admitting: Obstetrics

## 2022-03-01 ENCOUNTER — Ambulatory Visit: Payer: Medicaid Other | Admitting: Obstetrics

## 2022-03-01 NOTE — Progress Notes (Signed)
Entered in Error

## 2022-04-14 ENCOUNTER — Ambulatory Visit
Admission: EM | Admit: 2022-04-14 | Discharge: 2022-04-14 | Disposition: A | Discharge status: Home / Self Care | Payer: Medicaid Other | Attending: Nurse Practitioner | Admitting: Nurse Practitioner

## 2022-04-14 DIAGNOSIS — R300 Dysuria: Secondary | ICD-10-CM | POA: Insufficient documentation

## 2022-04-14 DIAGNOSIS — N898 Other specified noninflammatory disorders of vagina: Secondary | ICD-10-CM | POA: Diagnosis present

## 2022-04-14 LAB — POCT URINALYSIS DIP (MANUAL ENTRY)
Bilirubin, UA: NEGATIVE
Blood, UA: NEGATIVE
Glucose, UA: NEGATIVE mg/dL
Ketones, POC UA: NEGATIVE mg/dL
Leukocytes, UA: NEGATIVE
Nitrite, UA: NEGATIVE
Protein Ur, POC: NEGATIVE mg/dL
Spec Grav, UA: 1.02 (ref 1.010–1.025)
Urobilinogen, UA: 0.2 E.U./dL
pH, UA: 7 (ref 5.0–8.0)

## 2022-04-14 LAB — POCT URINE PREGNANCY: Preg Test, Ur: NEGATIVE

## 2022-04-14 MED ORDER — PHENAZOPYRIDINE HCL 200 MG PO TABS: 200.0000 mg | ORAL_TABLET | Freq: Three times a day (TID) | ORAL | 0 refills | Status: DC

## 2022-04-14 MED ORDER — METRONIDAZOLE 500 MG PO TABS: 500.0000 mg | ORAL_TABLET | Freq: Two times a day (BID) | ORAL | 0 refills | Status: DC

## 2022-04-14 MED ORDER — FLUCONAZOLE 150 MG PO TABS: 150.0000 mg | ORAL_TABLET | ORAL | 0 refills | Status: AC

## 2022-04-14 NOTE — Discharge Instructions (Addendum)
Urine cultures as well as testing for bacterial, yeast, gonorrhea, chlamydia and trichomonas is pending. You should not have any sexual activity until you receive the results of the tests. You will only be notified for positive results. You may go online to MyChart and review your results. Practice safe sex practices by wearing a condom every time you have sex. Remember that people who have STIs may not experience any symptoms. However, even without symptoms, these infections can be spread from person to person and require treatment. STIs can be treated, and many STIs can be cured. However, some STIs cannot be cured and will affect you for the rest of your life. It's important to be checked regularly for STIs. You should also consider taking pre-exposure prophylaxis (PrEP) to prevent HIV infection.

## 2022-04-14 NOTE — ED Provider Notes (Signed)
[name]    CSN: [id number] Arrival date & time: 04/14/22  1024      History   Chief Complaint Chief Complaint  Patient presents with   Vaginal Discharge    HPI Sue Clayton is a 37 y.o. female.   Subjective:   Sue Clayton is a 37 y.o. female who presents for evaluation of vaginal discharge.  The discharge was initially cottage cheeselike but now more thin in consistency.  There has not been any odor.  Symptoms have been present for 2 days.  Patient also endorses urinary urgency, urinary frequency, dysuria, low back pain, vaginal itching, vaginal irritation and suprapubic discomfort.  No nausea, vomiting or fever.  No genital sores.  Last menstrual period was 03/24/2022.  She is not on birth control.  She has been sexually active with 1 female partner within the past 3 months.    The following portions of the patient's history were reviewed and updated as appropriate: allergies, current medications, past family history, past medical history, past social history, past surgical history, and problem list.      Past Medical History:  Diagnosis Date   Anxiety    Depression    Gall stones    Infection    UTI   Kidney stones    Mental disorder    Vaginal Pap smear, abnormal    colpo- ok since    Patient Active Problem List   Diagnosis Date Noted   Smoker 01/04/2014   Pregnancy 01/04/2014   Anxiety state, unspecified 08/19/2013   Vaginal candidiasis 08/19/2013   Cellulitis, neck 08/17/2013    Past Surgical History:  Procedure Laterality Date   CESAREAN SECTION     LITHOTRIPSY      OB History     Gravida  10   Para  4   Term  4   Preterm      AB  5   Living  4      SAB      IAB  5   Ectopic      Multiple      Live Births  4            Home Medications    Prior to Admission medications   Medication Sig Start Date End Date Taking? Authorizing Provider  fluconazole (DIFLUCAN) 150 MG tablet Take 1 tablet (150  mg total) by mouth every 3 (three) days for 2 doses. 04/14/22 04/18/22 Yes Lurline Idol, FNP  metroNIDAZOLE (FLAGYL) 500 MG tablet Take 1 tablet (500 mg total) by mouth 2 (two) times daily. 04/14/22  Yes Lurline Idol, FNP  phenazopyridine (PYRIDIUM) 200 MG tablet Take 1 tablet (200 mg total) by mouth 3 (three) times daily. 04/14/22  Yes Lurline Idol, FNP  ibuprofen (ADVIL) 600 MG tablet Take 1 tablet (600 mg total) by mouth every 6 (six) hours as needed. 01/07/22   Ellsworth Lennox, PA-C  phentermine (ADIPEX-P) 37.5 MG tablet Take 37.5 mg by mouth every morning. 12/14/21   [provider]  Prenatal Vit-Fe Fumarate-FA (PRENATAL PLUS VITAMIN/MINERAL) 27-1 MG TABS Take 1 tablet by mouth daily. 06/25/20   Marny Lowenstein, PA-C    Family History Family History  Problem Relation Age of Onset   Hypertension Mother    Diabetes Mother     Social History Social History   Tobacco Use   Smoking status: Every Day    Packs/day: 0.50    Years: 19.00    Additional pack years: 0.00  Total pack years: 9.50    Types: Cigarettes   Smokeless tobacco: Never  Vaping Use   Vaping Use: Never used  Substance Use Topics   Alcohol use: Yes    Comment: drinking every day due to stress   Drug use: Yes    Frequency: 2.0 times per week    Types: Marijuana     Allergies   Patient has no known allergies.   Review of Systems Review of Systems  Constitutional:  Negative for fever.  Gastrointestinal:  Negative for nausea and vomiting.  Genitourinary:  Positive for dysuria, urgency and vaginal discharge. Negative for genital sores.  Musculoskeletal:  Positive for back pain.  All other systems reviewed and are negative.    Physical Exam Triage Vital Signs ED Triage Vitals  Enc Vitals Group     BP 04/14/22 1209 128/83     Pulse Rate 04/14/22 1209 76     Resp 04/14/22 1209 16     Temp 04/14/22 1209 97.9 F (36.6 C)     Temp Source 04/14/22 1209 Oral     SpO2 04/14/22 1209 99 %      Weight --      Height --      Head Circumference --      Peak Flow --      Pain Score 04/14/22 1210 9     Pain Loc --      Pain Edu? --      Excl. in GC? --    No data found.  Updated Vital Signs BP 128/83 (BP Location: Left Arm)   Pulse 76   Temp 97.9 F (36.6 C) (Oral)   Resp 16   LMP 03/24/2022 (Exact Date)   SpO2 99%   Visual Acuity Right Eye Distance:   Left Eye Distance:   Bilateral Distance:    Right Eye Near:   Left Eye Near:    Bilateral Near:     Physical Exam Vitals reviewed.  Constitutional:      Appearance: Normal appearance.  HENT:     Head: Normocephalic.  Cardiovascular:     Rate and Rhythm: Normal rate.  Genitourinary:    Comments: Deferred; patient performed self swab for testing  Musculoskeletal:        General: Normal range of motion.     Cervical back: Normal range of motion.  Skin:    General: Skin is warm and dry.  Neurological:     General: No focal deficit present.     Mental Status: She is alert and oriented to person, place, and time.      UC Treatments / Results  Labs (all labs ordered are listed, but only abnormal results are displayed) Labs Reviewed  URINE CULTURE  POCT URINALYSIS DIP (MANUAL ENTRY)  POCT URINE PREGNANCY  CERVICOVAGINAL ANCILLARY ONLY    EKG   Radiology No results found.  Procedures Procedures (including critical care time)  Medications Ordered in UC Medications - No data to display  Initial Impression / Assessment and Plan / UC Course  I have reviewed the triage vital signs and the nursing notes.  Pertinent labs & imaging results that were available during my care of the patient were reviewed by me and considered in my medical decision making (see chart for details).    37 year old female presenting with vaginal discharge, urinary urgency, urinary frequency, dysuria, low back pain, vaginal itching, vaginal irritation and suprapubic discomfort.  Patient is afebrile.  Nontoxic.  Urine  pregnancy negative.  Urinalysis unremarkable. Urine cultures as well as testing for bacterial, yeast, gonorrhea, chlamydia and trichomonas is pending.  Patient advised to abstain from any sexual activity until the results of the tests are received and all treatment has been completed.  Patient will be started on Diflucan, Flagyl and Pyridium empirically.  Today's evaluation has revealed no signs of a dangerous process. Discussed diagnosis with patient and/or guardian. Patient and/or guardian aware of their diagnosis, possible red flag symptoms to watch out for and need for close follow up. Patient and/or guardian understands verbal and written discharge instructions. Patient and/or guardian comfortable with plan and disposition.  Patient and/or guardian has a clear mental status at this time, good insight into illness (after discussion and teaching) and has clear judgment to make decisions regarding their care  Documentation was completed with the aid of voice recognition software. Transcription may contain typographical errors. Final Clinical Impressions(s) / UC Diagnoses   Final diagnoses:  Dysuria  Vaginal discharge     Discharge Instructions      Urine cultures as well as testing for bacterial, yeast, gonorrhea, chlamydia and trichomonas is pending. You should not have any sexual activity until you receive the results of the tests. You will only be notified for positive results. You may go online to MyChart and review your results. Practice safe sex practices by wearing a condom every time you have sex. Remember that people who have STIs may not experience any symptoms. However, even without symptoms, these infections can be spread from person to person and require treatment. STIs can be treated, and many STIs can be cured. However, some STIs cannot be cured and will affect you for the rest of your life. It's important to be checked regularly for STIs. You should also consider taking pre-exposure  prophylaxis (PrEP) to prevent HIV infection.     ED Prescriptions     Medication Sig Dispense Auth. Provider   metroNIDAZOLE (FLAGYL) 500 MG tablet Take 1 tablet (500 mg total) by mouth 2 (two) times daily. 14 tablet Lurline Idol, FNP   fluconazole (DIFLUCAN) 150 MG tablet Take 1 tablet (150 mg total) by mouth every 3 (three) days for 2 doses. 2 tablet Lurline Idol, FNP   phenazopyridine (PYRIDIUM) 200 MG tablet Take 1 tablet (200 mg total) by mouth 3 (three) times daily. 6 tablet Lurline Idol, FNP      PDMP not reviewed this encounter.   Lurline Idol, [place] 04/14/22 1243

## 2022-04-14 NOTE — ED Triage Notes (Signed)
Pt states white clumpy vaginal discharge and lower abdominal pain with urinary urgency for the past 2 days.

## 2022-04-15 LAB — URINE CULTURE: Culture: NO GROWTH

## 2022-04-15 LAB — CERVICOVAGINAL ANCILLARY ONLY
Bacterial Vaginitis (gardnerella): NEGATIVE
Candida Glabrata: NEGATIVE
Candida Vaginitis: NEGATIVE
Chlamydia: NEGATIVE
Comment: NEGATIVE
Comment: NEGATIVE
Comment: NEGATIVE
Comment: NEGATIVE
Comment: NEGATIVE
Comment: NORMAL
Neisseria Gonorrhea: NEGATIVE
Trichomonas: NEGATIVE

## 2022-07-09 ENCOUNTER — Emergency Department (HOSPITAL_COMMUNITY)
Admission: EM | Admit: 2022-07-09 | Discharge: 2022-07-09 | Disposition: A | Discharge status: Home / Self Care | Payer: Medicaid Other | Attending: Emergency Medicine | Admitting: Emergency Medicine

## 2022-07-09 ENCOUNTER — Other Ambulatory Visit: Payer: Self-pay

## 2022-07-09 DIAGNOSIS — L03221 Cellulitis of neck: Secondary | ICD-10-CM | POA: Diagnosis not present

## 2022-07-09 DIAGNOSIS — M542 Cervicalgia: Secondary | ICD-10-CM | POA: Diagnosis present

## 2022-07-09 LAB — COMPREHENSIVE METABOLIC PANEL
ALT: 19 U/L (ref 0–44)
AST: 26 U/L (ref 15–41)
Albumin: 3.3 g/dL — ABNORMAL LOW (ref 3.5–5.0)
Alkaline Phosphatase: 53 U/L (ref 38–126)
Anion gap: 7 (ref 5–15)
BUN: 13 mg/dL (ref 6–20)
CO2: 25 mmol/L (ref 22–32)
Calcium: 8.4 mg/dL — ABNORMAL LOW (ref 8.9–10.3)
Chloride: 104 mmol/L (ref 98–111)
Creatinine, Ser: 0.75 mg/dL (ref 0.44–1.00)
GFR, Estimated: >60 mL/min (ref >60)
Glucose, Bld: 87 mg/dL (ref 70–99)
Potassium: 3.7 mmol/L (ref 3.5–5.1)
Sodium: 136 mmol/L (ref 135–145)
Total Bilirubin: 0.8 mg/dL (ref 0.3–1.2)
Total Protein: 6.4 g/dL — ABNORMAL LOW (ref 6.5–8.1)

## 2022-07-09 LAB — CBC WITH DIFFERENTIAL/PLATELET
Abs Immature Granulocytes: 0.01 10*3/uL (ref 0.00–0.07)
Basophils Absolute: 0 10*3/uL (ref 0.0–0.1)
Basophils Relative: 1 %
Eosinophils Absolute: 0.2 10*3/uL (ref 0.0–0.5)
Eosinophils Relative: 2 %
HCT: 35.5 % — ABNORMAL LOW (ref 36.0–46.0)
Hemoglobin: 11.5 g/dL — ABNORMAL LOW (ref 12.0–15.0)
Immature Granulocytes: 0 %
Lymphocytes Relative: 48 %
Lymphs Abs: 3 10*3/uL (ref 0.7–4.0)
MCH: 27.4 pg (ref 26.0–34.0)
MCHC: 32.4 g/dL (ref 30.0–36.0)
MCV: 84.5 fL (ref 80.0–100.0)
Monocytes Absolute: 0.6 10*3/uL (ref 0.1–1.0)
Monocytes Relative: 10 %
Neutro Abs: 2.4 10*3/uL (ref 1.7–7.7)
Neutrophils Relative %: 39 %
Platelets: 231 10*3/uL (ref 150–400)
RBC: 4.2 MIL/uL (ref 3.87–5.11)
RDW: 16.7 % — ABNORMAL HIGH (ref 11.5–15.5)
WBC: 6.2 10*3/uL (ref 4.0–10.5)
nRBC: 0 % (ref 0.0–0.2)

## 2022-07-09 LAB — PREGNANCY, URINE: Preg Test, Ur: NEGATIVE

## 2022-07-09 MED ORDER — SODIUM CHLORIDE 0.9 % IV SOLN
1.0000 g | Freq: Once | INTRAVENOUS | Status: AC
  Administered 2022-07-09: 1 g via INTRAVENOUS
  Filled 2022-07-09: qty 10

## 2022-07-09 MED ORDER — SULFAMETHOXAZOLE-TRIMETHOPRIM 800-160 MG PO TABS: 1.0000 | ORAL_TABLET | Freq: Two times a day (BID) | ORAL | 0 refills | Status: AC

## 2022-07-09 MED ORDER — HYDROCODONE-ACETAMINOPHEN 5-325 MG PO TABS
1.0000 | ORAL_TABLET | Freq: Once | ORAL | Status: AC
  Administered 2022-07-09: 1 via ORAL
  Filled 2022-07-09: qty 1

## 2022-07-09 MED ORDER — SULFAMETHOXAZOLE-TRIMETHOPRIM 400-80 MG/5ML IV SOLN: 160.0000 mg | INTRAVENOUS | Status: DC

## 2022-07-09 MED ORDER — KETOROLAC TROMETHAMINE 15 MG/ML IJ SOLN
15.0000 mg | Freq: Once | INTRAMUSCULAR | Status: AC
  Administered 2022-07-09: 15 mg via INTRAVENOUS
  Filled 2022-07-09: qty 1

## 2022-07-09 MED ORDER — IBUPROFEN 400 MG PO TABS
600.0000 mg | ORAL_TABLET | Freq: Once | ORAL | Status: DC
  Filled 2022-07-09: qty 1

## 2022-07-09 MED ORDER — FLUCONAZOLE 150 MG PO TABS: 150.0000 mg | ORAL_TABLET | Freq: Every day | ORAL | 0 refills | Status: AC

## 2022-07-09 MED ORDER — CEFTRIAXONE SODIUM 1 G IJ SOLR: 1.0000 g | Freq: Once | INTRAMUSCULAR | Status: DC

## 2022-07-09 NOTE — ED Notes (Signed)
Awaiting Bactrim from main pharmacy to administer.

## 2022-07-09 NOTE — ED Triage Notes (Signed)
Pt. Stated, I have a cyst or abscess in the back of my head at the neck for the last 5 weeks. I had the same thing in 2016 but was given Bactrim and it went away and now its back. It makes me have a headache and my neck hurt.

## 2022-07-09 NOTE — ED Provider Notes (Signed)
[place] EMERGENCY DEPARTMENT AT [facility] Provider Note   CSN: [id number] Arrival date & time: 07/09/22  0930     History {Add pertinent medical, surgical, social history, OB history to HPI:1} Chief Complaint  Patient presents with   Headache   Abscess    Sue Clayton is a 37 y.o. female.  37 year old female with a history of neck cellulitis requiring hospitalization presents emergency department with neck pain.  Patient reports that off-and-on for the past few months she has had left-sided neck pain.  Says that recently it started to become more severe and painful.  It is swollen on the left side of her neck as well.  No fevers recently but is starting to have some bodyaches.  Feels similar to prior when she was hospitalized in 2015 for cellulitis of her neck in the area.  No runny nose or sore throat.  No vision changes, numbness or weakness of her arms or legs.  Does state that the pain is now radiating up her head and down her neck.  During that hospitalization did have both CT and ultrasound that did not reveal any abscesses or other acute underlying pathology.       Home Medications Prior to Admission medications   Medication Sig Start Date End Date Taking? Authorizing Provider  ibuprofen (ADVIL) 600 MG tablet Take 1 tablet (600 mg total) by mouth every 6 (six) hours as needed. 01/07/22   Ellsworth Lennox, PA-C  metroNIDAZOLE (FLAGYL) 500 MG tablet Take 1 tablet (500 mg total) by mouth 2 (two) times daily. 04/14/22   Lurline Idol, FNP  phenazopyridine (PYRIDIUM) 200 MG tablet Take 1 tablet (200 mg total) by mouth 3 (three) times daily. 04/14/22   Lurline Idol, FNP  phentermine (ADIPEX-P) 37.5 MG tablet Take 37.5 mg by mouth every morning. 12/14/21   [provider]  Prenatal Vit-Fe Fumarate-FA (PRENATAL PLUS VITAMIN/MINERAL) 27-1 MG TABS Take 1 tablet by mouth daily. 06/25/20   Marny Lowenstein, PA-C      Allergies    Patient has no known  allergies.    Review of Systems   Review of Systems  Physical Exam Updated Vital Signs BP 130/88 (BP Location: Right Arm)   Pulse 94   Temp 98.1 F (36.7 C) (Oral)   Resp 18   Ht 5\' 7"  (1.702 m)   Wt 97.1 kg   LMP 06/19/2022   SpO2 100%   BMI 33.52 kg/m  Physical Exam Vitals and nursing note reviewed.  Constitutional:      General: She is not in acute distress.    Appearance: She is well-developed.  HENT:     Head: Normocephalic and atraumatic.     Right Ear: External ear normal.     Left Ear: External ear normal.     Nose: Nose normal.  Eyes:     Extraocular Movements: Extraocular movements intact.     Conjunctiva/sclera: Conjunctivae normal.     Pupils: Pupils are equal, round, and reactive to light.  Neck:     Comments: Tender area of induration over left posterior neck.  No fluctuance palpated.  Minimal erythema. Cardiovascular:     Rate and Rhythm: Normal rate and regular rhythm.     Heart sounds: No murmur heard. Musculoskeletal:     Cervical back: Normal range of motion and neck supple.     Right lower leg: No edema.     Left lower leg: No edema.  Lymphadenopathy:     Cervical: Cervical  adenopathy present.  Skin:    General: Skin is warm and dry.  Neurological:     Mental Status: She is alert and oriented to person, place, and time. Mental status is at baseline.     Comments: MENTAL STATUS: AAOx3 CRANIAL NERVES: II: Pupils equal and reactive 4 mm BL, no RAPD, no VF deficits III, IV, VI: EOM intact, no gaze preference or deviation, no nystagmus. V: normal sensation to light touch in V1, V2, and V3 segments bilaterally VII: no facial weakness or asymmetry, no nasolabial fold flattening VIII: normal hearing to speech and finger friction IX, X: normal palatal elevation, no uvular deviation XI: 5/5 head turn and 5/5 shoulder shrug bilaterally XII: midline tongue protrusion MOTOR: 5/5 strength in R shoulder flexion, elbow flexion and extension, and grip  strength. 5/5 strength in L shoulder flexion, elbow flexion and extension, and grip strength.  5/5 strength in R hip and knee flexion, knee extension, ankle plantar and dorsiflexion. 5/5 strength in L hip and knee flexion, knee extension, ankle plantar and dorsiflexion. SENSORY: Normal sensation to light touch in all extremities COORD: Normal finger to nose and heel to shin, no tremor, no dysmetria STATION: no truncal ataxia   Psychiatric:        Mood and Affect: Mood normal.     ED Results / Procedures / Treatments   Labs (all labs ordered are listed, but only abnormal results are displayed) Labs Reviewed  CBC WITH DIFFERENTIAL/PLATELET - Abnormal; Notable for the following components:      Result Value   Hemoglobin 11.5 (*)    HCT 35.5 (*)    RDW 16.7 (*)    All other components within normal limits  COMPREHENSIVE METABOLIC PANEL    EKG None  Radiology No results found.  Procedures Procedures  {Document cardiac monitor, telemetry assessment procedure when appropriate:1}  Medications Ordered in ED Medications - No data to display  ED Course/ Medical Decision Making/ A&P   {   Click here for ABCD2, HEART and other calculatorsREFRESH Note before signing :1}                          Medical Decision Making Amount and/or Complexity of Data Reviewed Labs: ordered.  Risk Prescription drug management.   ***  {Document critical care time when appropriate:1} {Document review of labs and clinical decision tools ie heart score, Chads2Vasc2 etc:1}  {Document your independent review of radiology images, and any outside records:1} {Document your discussion with family members, caretakers, and with consultants:1} {Document social determinants of health affecting pt's care:1} {Document your decision making why or why not admission, treatments were needed:1} Final Clinical Impression(s) / ED Diagnoses Final diagnoses:  None    Rx / DC Orders ED Discharge Orders      None

## 2022-07-09 NOTE — Discharge Instructions (Signed)
You were seen for the skin infection on your neck in the emergency department.   At home, please take the antibiotic we have prescribed you (Bactrim) for your infection.  Take Tylenol and ibuprofen for your pain.  You may also take the Norco for any breakthrough pain that you have.  Check your MyChart online for the results of any tests that had not resulted by the time you left the emergency department.   Follow-up with your primary doctor in 2-3 days regarding your visit.    Return immediately to the emergency department if you experience any of the following: Fevers, or any other concerning symptoms.    Thank you for visiting our Emergency Department. It was a pleasure taking care of you today.

## 2022-11-10 ENCOUNTER — Emergency Department (HOSPITAL_COMMUNITY): Payer: Medicaid Other

## 2022-11-10 ENCOUNTER — Emergency Department (HOSPITAL_COMMUNITY)
Admission: EM | Admit: 2022-11-10 | Discharge: 2022-11-10 | Disposition: A | Discharge status: Home / Self Care | Payer: Medicaid Other | Attending: Emergency Medicine | Admitting: Emergency Medicine

## 2022-11-10 ENCOUNTER — Encounter (HOSPITAL_COMMUNITY): Payer: Self-pay

## 2022-11-10 DIAGNOSIS — R350 Frequency of micturition: Secondary | ICD-10-CM | POA: Insufficient documentation

## 2022-11-10 DIAGNOSIS — R300 Dysuria: Secondary | ICD-10-CM | POA: Diagnosis not present

## 2022-11-10 DIAGNOSIS — R1030 Lower abdominal pain, unspecified: Secondary | ICD-10-CM | POA: Insufficient documentation

## 2022-11-10 DIAGNOSIS — R109 Unspecified abdominal pain: Secondary | ICD-10-CM

## 2022-11-10 DIAGNOSIS — F17210 Nicotine dependence, cigarettes, uncomplicated: Secondary | ICD-10-CM | POA: Diagnosis not present

## 2022-11-10 LAB — COMPREHENSIVE METABOLIC PANEL
ALT: 18 U/L (ref 0–44)
AST: 21 U/L (ref 15–41)
Albumin: 4.2 g/dL (ref 3.5–5.0)
Alkaline Phosphatase: 57 U/L (ref 38–126)
Anion gap: 12 (ref 5–15)
BUN: 17 mg/dL (ref 6–20)
CO2: 19 mmol/L — ABNORMAL LOW (ref 22–32)
Calcium: 8.9 mg/dL (ref 8.9–10.3)
Chloride: 105 mmol/L (ref 98–111)
Creatinine, Ser: 0.78 mg/dL (ref 0.44–1.00)
GFR, Estimated: >60 mL/min (ref >60)
Glucose, Bld: 82 mg/dL (ref 70–99)
Potassium: 4.3 mmol/L (ref 3.5–5.1)
Sodium: 136 mmol/L (ref 135–145)
Total Bilirubin: 0.5 mg/dL (ref <1.2)
Total Protein: 7.6 g/dL (ref 6.5–8.1)

## 2022-11-10 LAB — CBC WITH DIFFERENTIAL/PLATELET
Abs Immature Granulocytes: 0.01 10*3/uL (ref 0.00–0.07)
Basophils Absolute: 0 10*3/uL (ref 0.0–0.1)
Basophils Relative: 1 %
Eosinophils Absolute: 0.2 10*3/uL (ref 0.0–0.5)
Eosinophils Relative: 3 %
HCT: 41.3 % (ref 36.0–46.0)
Hemoglobin: 13.5 g/dL (ref 12.0–15.0)
Immature Granulocytes: 0 %
Lymphocytes Relative: 53 %
Lymphs Abs: 3.4 10*3/uL (ref 0.7–4.0)
MCH: 28.1 pg (ref 26.0–34.0)
MCHC: 32.7 g/dL (ref 30.0–36.0)
MCV: 86 fL (ref 80.0–100.0)
Monocytes Absolute: 0.5 10*3/uL (ref 0.1–1.0)
Monocytes Relative: 8 %
Neutro Abs: 2.3 10*3/uL (ref 1.7–7.7)
Neutrophils Relative %: 35 %
Platelets: 249 10*3/uL (ref 150–400)
RBC: 4.8 MIL/uL (ref 3.87–5.11)
RDW: 14.9 % (ref 11.5–15.5)
WBC: 6.4 10*3/uL (ref 4.0–10.5)
nRBC: 0 % (ref 0.0–0.2)

## 2022-11-10 LAB — URINALYSIS, W/ REFLEX TO CULTURE (INFECTION SUSPECTED)
Bacteria, UA: NONE SEEN
Bilirubin Urine: NEGATIVE
Glucose, UA: NEGATIVE mg/dL
Hgb urine dipstick: NEGATIVE
Ketones, ur: NEGATIVE mg/dL
Leukocytes,Ua: NEGATIVE
Nitrite: NEGATIVE
Protein, ur: NEGATIVE mg/dL
Specific Gravity, Urine: 1.002 — ABNORMAL LOW (ref 1.005–1.030)
pH: 6 (ref 5.0–8.0)

## 2022-11-10 MED ORDER — IOHEXOL 300 MG/ML  SOLN
100.0000 mL | Freq: Once | INTRAMUSCULAR | Status: AC | PRN
  Administered 2022-11-10: 100 mL via INTRAVENOUS

## 2022-11-10 MED ORDER — HYDROCODONE-ACETAMINOPHEN 5-325 MG PO TABS
1.0000 | ORAL_TABLET | Freq: Once | ORAL | Status: AC
  Administered 2022-11-10: 1 via ORAL
  Filled 2022-11-10: qty 1

## 2022-11-10 MED ORDER — KETOROLAC TROMETHAMINE 15 MG/ML IJ SOLN
15.0000 mg | Freq: Once | INTRAMUSCULAR | Status: AC
  Administered 2022-11-10: 15 mg via INTRAMUSCULAR
  Filled 2022-11-10: qty 1

## 2022-11-10 NOTE — ED Provider Notes (Signed)
[place] EMERGENCY DEPARTMENT AT [facility] Provider Note  CSN: [id number] Arrival date & time: 11/10/22 3329  Chief Complaint(s) Generalized Body Aches  HPI Sue Clayton is a 37 y.o. female presenting with bodyaches.  Patient reports body aches.  She reports these have been going on intermittently for 3 years.  She reports that previously she had bodyaches and was diagnosed with a cellulitis in her neck.  She reports that she has no rashes anywhere on her body currently.  She does report some lower abdominal pain, burning with urination, and urinary frequency.  No flank pain.  No fevers or chills.  No chest pain or shortness of breath.  No sore throat, runny nose.   Past Medical History Past Medical History:  Diagnosis Date   Anxiety    Depression    Gall stones    Infection    UTI   Kidney stones    Mental disorder    Vaginal Pap smear, abnormal    colpo- ok since   Patient Active Problem List   Diagnosis Date Noted   Smoker 01/04/2014   Pregnancy 01/04/2014   Anxiety state 08/19/2013   Vaginal candidiasis 08/19/2013   Cellulitis, neck 08/17/2013   Home Medication(s) Prior to Admission medications   Medication Sig Start Date End Date Taking? Authorizing Provider  ibuprofen (ADVIL) 600 MG tablet Take 1 tablet (600 mg total) by mouth every 6 (six) hours as needed. 01/07/22   Ellsworth Lennox, PA-C  metroNIDAZOLE (FLAGYL) 500 MG tablet Take 1 tablet (500 mg total) by mouth 2 (two) times daily. 04/14/22   Lurline Idol, FNP  phenazopyridine (PYRIDIUM) 200 MG tablet Take 1 tablet (200 mg total) by mouth 3 (three) times daily. 04/14/22   Lurline Idol, FNP  phentermine (ADIPEX-P) 37.5 MG tablet Take 37.5 mg by mouth every morning. 12/14/21   [provider]  Prenatal Vit-Fe Fumarate-FA (PRENATAL PLUS VITAMIN/MINERAL) 27-1 MG TABS Take 1 tablet by mouth daily. 06/25/20   Marny Lowenstein, PA-C                                                                                                                                     Past Surgical History Past Surgical History:  Procedure Laterality Date   CESAREAN SECTION     LITHOTRIPSY     Family History Family History  Problem Relation Age of Onset   Hypertension Mother    Diabetes Mother     Social History Social History   Tobacco Use   Smoking status: Every Day    Current packs/day: 0.50    Average packs/day: 0.5 packs/day for 19.0 years (9.5 ttl pk-yrs)    Types: Cigarettes   Smokeless tobacco: Never  Vaping Use   Vaping status: Never Used  Substance Use Topics   Alcohol use: Yes    Comment: drinking every day due to stress   Drug use: Yes  Frequency: 2.0 times per week    Types: Marijuana   Allergies Patient has no known allergies.  Review of Systems Review of Systems  All other systems reviewed and are negative.   Physical Exam Vital Signs  I have reviewed the triage vital signs BP 133/82 (BP Location: Right Arm)   Pulse (!) 55   Temp 98 F (36.7 C)   Resp 16   Ht 5\' 7"  (1.702 m)   Wt 102.1 kg   LMP 10/18/2022 (Approximate)   SpO2 99%   BMI 35.24 kg/m  Physical Exam Vitals and nursing note reviewed.  Constitutional:      General: She is not in acute distress.    Appearance: She is well-developed.  HENT:     Head: Normocephalic and atraumatic.     Mouth/Throat:     Mouth: Mucous membranes are moist.  Eyes:     Pupils: Pupils are equal, round, and reactive to light.  Neck:     Comments: No focal erythema, induration, tenderness to the posterior neck Cardiovascular:     Rate and Rhythm: Normal rate and regular rhythm.     Heart sounds: No murmur heard. Pulmonary:     Effort: Pulmonary effort is normal. No respiratory distress.     Breath sounds: Normal breath sounds.  Abdominal:     General: Abdomen is flat.     Palpations: Abdomen is soft.     Tenderness: There is abdominal tenderness (suprapubic).  Musculoskeletal:        General: No  tenderness.     Cervical back: Normal range of motion and neck supple.     Right lower leg: No edema.     Left lower leg: No edema.  Skin:    General: Skin is warm and dry.  Neurological:     General: No focal deficit present.     Mental Status: She is alert. Mental status is at baseline.  Psychiatric:        Mood and Affect: Mood normal.        Behavior: Behavior normal.     ED Results and Treatments Labs (all labs ordered are listed, but only abnormal results are displayed) Labs Reviewed  COMPREHENSIVE METABOLIC PANEL - Abnormal; Notable for the following components:      Result Value   CO2 19 (*)    All other components within normal limits  URINALYSIS, W/ REFLEX TO CULTURE (INFECTION SUSPECTED) - Abnormal; Notable for the following components:   Color, Urine COLORLESS (*)    Specific Gravity, Urine 1.002 (*)    All other components within normal limits  CBC WITH DIFFERENTIAL/PLATELET                                                                                                                          Radiology US Pelvis Complete  Result Date: 11/10/2022 CLINICAL DATA:  Pelvic pain EXAM: TRANSABDOMINAL AND TRANSVAGINAL ULTRASOUND OF PELVIS DOPPLER ULTRASOUND OF OVARIES TECHNIQUE: Both transabdominal and  transvaginal ultrasound examinations of the pelvis were performed. Transabdominal technique was performed for global imaging of the pelvis including uterus, ovaries, adnexal regions, and pelvic cul-de-sac. It was necessary to proceed with endovaginal exam following the transabdominal exam to visualize the endometrium and adnexa. Color and duplex Doppler ultrasound was utilized to evaluate blood flow to the ovaries. COMPARISON:  CT earlier 11/10/2022.  Ob ultrasound 08/01/2020. FINDINGS: Uterus Measurements: 9.6 x 5.7 x 6.3 cm = volume: 179 mL. No fibroids or other mass visualized. Endometrium Thickness: 10 mm.  No focal abnormality visualized. Right ovary Measurements: 3.5 x 1.8  x 2.6 cm = volume: 8.3 mL. 2.0 x 1.6 by 2.0 cm hypoechoic structure, cystic focus. Left ovary Measurements: 3.6 x 1.6 x 2.4 cm = volume: 7.4 mL. Normal appearance/no adnexal mass. Pulsed Doppler evaluation of both ovaries demonstrates normal low-resistance arterial and venous waveforms. Other findings Small amount of free fluid in the pelvis. IMPRESSION: 2 cm right ovarian cystic lesion, minimally complex. Blood flow on doppler. Small amount of free fluid in the pelvis. No followup imaging recommended in the absence of symptoms. Note: This recommendation does not apply to premenarchal patients or to those with increased risk (genetic, family history, elevated tumor markers or other high-risk factors) of ovarian cancer. Reference: Radiology 2019 Nov; 293(2):359-371. Electronically Signed   By: Karen Kays M.D.   On: 11/10/2022 17:03   US Transvaginal Non-OB  Result Date: 11/10/2022 CLINICAL DATA:  Pelvic pain EXAM: TRANSABDOMINAL AND TRANSVAGINAL ULTRASOUND OF PELVIS DOPPLER ULTRASOUND OF OVARIES TECHNIQUE: Both transabdominal and transvaginal ultrasound examinations of the pelvis were performed. Transabdominal technique was performed for global imaging of the pelvis including uterus, ovaries, adnexal regions, and pelvic cul-de-sac. It was necessary to proceed with endovaginal exam following the transabdominal exam to visualize the endometrium and adnexa. Color and duplex Doppler ultrasound was utilized to evaluate blood flow to the ovaries. COMPARISON:  CT earlier 11/10/2022.  Ob ultrasound 08/01/2020. FINDINGS: Uterus Measurements: 9.6 x 5.7 x 6.3 cm = volume: 179 mL. No fibroids or other mass visualized. Endometrium Thickness: 10 mm.  No focal abnormality visualized. Right ovary Measurements: 3.5 x 1.8 x 2.6 cm = volume: 8.3 mL. 2.0 x 1.6 by 2.0 cm hypoechoic structure, cystic focus. Left ovary Measurements: 3.6 x 1.6 x 2.4 cm = volume: 7.4 mL. Normal appearance/no adnexal mass. Pulsed Doppler evaluation of  both ovaries demonstrates normal low-resistance arterial and venous waveforms. Other findings Small amount of free fluid in the pelvis. IMPRESSION: 2 cm right ovarian cystic lesion, minimally complex. Blood flow on doppler. Small amount of free fluid in the pelvis. No followup imaging recommended in the absence of symptoms. Note: This recommendation does not apply to premenarchal patients or to those with increased risk (genetic, family history, elevated tumor markers or other high-risk factors) of ovarian cancer. Reference: Radiology 2019 Nov; 293(2):359-371. Electronically Signed   By: Karen Kays M.D.   On: 11/10/2022 17:03   [name] Art/Ven Flow Abd Pelv Doppler  Result Date: 11/10/2022 CLINICAL DATA:  Pelvic pain EXAM: TRANSABDOMINAL AND TRANSVAGINAL ULTRASOUND OF PELVIS DOPPLER ULTRASOUND OF OVARIES TECHNIQUE: Both transabdominal and transvaginal ultrasound examinations of the pelvis were performed. Transabdominal technique was performed for global imaging of the pelvis including uterus, ovaries, adnexal regions, and pelvic cul-de-sac. It was necessary to proceed with endovaginal exam following the transabdominal exam to visualize the endometrium and adnexa. Color and duplex Doppler ultrasound was utilized to evaluate blood flow to the ovaries. COMPARISON:  CT earlier 11/10/2022.  Ob ultrasound 08/01/2020.  FINDINGS: Uterus Measurements: 9.6 x 5.7 x 6.3 cm = volume: 179 mL. No fibroids or other mass visualized. Endometrium Thickness: 10 mm.  No focal abnormality visualized. Right ovary Measurements: 3.5 x 1.8 x 2.6 cm = volume: 8.3 mL. 2.0 x 1.6 by 2.0 cm hypoechoic structure, cystic focus. Left ovary Measurements: 3.6 x 1.6 x 2.4 cm = volume: 7.4 mL. Normal appearance/no adnexal mass. Pulsed Doppler evaluation of both ovaries demonstrates normal low-resistance arterial and venous waveforms. Other findings Small amount of free fluid in the pelvis. IMPRESSION: 2 cm right ovarian cystic lesion, minimally complex.  Blood flow on doppler. Small amount of free fluid in the pelvis. No followup imaging recommended in the absence of symptoms. Note: This recommendation does not apply to premenarchal patients or to those with increased risk (genetic, family history, elevated tumor markers or other high-risk factors) of ovarian cancer. Reference: Radiology 2019 Nov; 293(2):359-371. Electronically Signed   By: Karen Kays M.D.   On: 11/10/2022 17:03   CT ABDOMEN PELVIS W CONTRAST  Result Date: 11/10/2022 CLINICAL DATA:  Abdominal pain EXAM: CT ABDOMEN AND PELVIS WITH CONTRAST TECHNIQUE: Multidetector CT imaging of the abdomen and pelvis was performed using the standard protocol following bolus administration of intravenous contrast. RADIATION DOSE REDUCTION: This exam was performed according to the departmental dose-optimization program which includes automated exposure control, adjustment of the mA and/or kV according to patient size and/or use of iterative reconstruction technique. CONTRAST:  OMNIPAQUE IOHEXOL 300 MG/ML  SOLN COMPARISON:  02/20/15 CT FINDINGS: Lower chest: Lung bases are clear Hepatobiliary: Diffuse fatty liver infiltration. Patent portal vein. Stones in the nondilated gallbladder. Pancreas: Unremarkable. No pancreatic ductal dilatation or surrounding inflammatory changes. Spleen: Normal in size without focal abnormality. Adrenals/Urinary Tract: Adrenal glands are unremarkable. Kidneys are normal, without renal calculi, focal lesion, or hydronephrosis. Bladder is unremarkable. Stomach/Bowel: Moderate diffuse colonic stool. Large bowel is of normal course and caliber. Normal appendix. Stomach and small bowel are nondilated. Vascular/Lymphatic: No significant vascular findings are present. No enlarged abdominal or pelvic lymph nodes. Reproductive: Uterus is present. There is small cystic lesion seen in the upper pelvis posteriorly measuring 20 mm. Likely ovarian. No separate adnexal mass. No follow-up imaging  recommended. Note: This recommendation does not apply to premenarchal patients and to those with increased risk (genetic, family history, elevated tumor markers or other high-risk factors) of ovarian cancer. Reference: JACR 2020 Feb; 17(2):248-254 Other: Trace free fluid in the pelvis.  No free air. Musculoskeletal: Mild degenerative changes along the spine. IMPRESSION: No bowel obstruction.  Scattered stool.  Normal appendix. Fatty liver infiltration.  Gallstones in the nondilated gallbladder. 2 cm right-sided ovarian cystic lesion with some trace free fluid in the pelvis. No specific imaging follow up in the absence of symptoms. If there is concern of ovarian torsion an ultrasound may be useful as the next step in the workup Electronically Signed   By: Karen Kays M.D.   On: 11/10/2022 13:19    Pertinent labs & imaging results that were available during my care of the patient were reviewed by me and considered in my medical decision making (see MDM for details).  Medications Ordered in ED Medications  ketorolac (TORADOL) 15 MG/ML injection 15 mg (15 mg Intramuscular Given 11/10/22 0918)  iohexol (OMNIPAQUE) 300 MG/ML solution 100 mL (100 mLs Intravenous Contrast Given 11/10/22 1058)  HYDROcodone-acetaminophen (NORCO/VICODIN) 5-325 MG per tablet 1 tablet (1 tablet Oral Given 11/10/22 1303)  Procedures Procedures  (including critical care time)  Medical Decision Making / ED Course   MDM:  37 year old presenting to the emergency room with bodyaches.  Patient overall well-appearing, physical exam with some mild suprapubic tenderness.  Differential includes UTI, other intra-abdominal process such as appendicitis, diverticulitis, colitis, obstruction.  Suspect most likely cause is UTI.  She does report these bodyaches have been going on for 3 years so unclear if there  is any acute process related to be specifically.  She does report that urinary symptoms are new.  Will check urinalysis, labs.  If no clear cause of symptoms on urinalysis given abdominal tenderness will consider CT scan.  Clinical Course as of 11/11/22 0720  Thu Nov 10, 2022  1330 CT negative other than ovarian cyst. Will proceed with [name] given cyst visualized on CT and lower abdominal pain.  [WS]  1644 Pelvic ultrasound results have not yet returned.  The patient decided she cannot wait due to some other obligations.  Discussed with the patient risks benefits leaving AGAINST MEDICAL ADVICE.  The patient demonstrates capacity to make her own medical decisions.  She understands that we may not have diagnosed her underlying condition she may have a dangerous process such as torsion or other dangerous condition. She understands leaving against medical advice could result in permanent injury, infertility, disability, or death. She understands she can return to the emergency department for any reason. Discussed return precautions and provided discharge paperwork. Advised follow up with OB Gyn  [WS]    Clinical Course User Index [WS] Lonell Grandchild, MD     Additional history obtained: -Additional history obtained from family -External records from outside source obtained and reviewed including: Chart review including previous notes, labs, imaging, consultation notes including previous ER visit for body aches    Lab Tests: -I ordered, reviewed, and interpreted labs.   The pertinent results include:   Labs Reviewed  COMPREHENSIVE METABOLIC PANEL - Abnormal; Notable for the following components:      Result Value   CO2 19 (*)    All other components within normal limits  URINALYSIS, W/ REFLEX TO CULTURE (INFECTION SUSPECTED) - Abnormal; Notable for the following components:   Color, Urine COLORLESS (*)    Specific Gravity, Urine 1.002 (*)    All other components within normal limits  CBC  WITH DIFFERENTIAL/PLATELET     Imaging Studies ordered: I ordered imaging studies including CT abdomen  On my interpretation imaging demonstrates ovarian cyst  I independently visualized and interpreted imaging. I agree with the radiologist interpretation   Medicines ordered and prescription drug management: Meds ordered this encounter  Medications   ketorolac (TORADOL) 15 MG/ML injection 15 mg   iohexol (OMNIPAQUE) 300 MG/ML solution 100 mL   HYDROcodone-acetaminophen (NORCO/VICODIN) 5-325 MG per tablet 1 tablet    -I have reviewed the patients home medicines and have made adjustments as needed  Social Determinants of Health:  Diagnosis or treatment significantly limited by social determinants of health: obesity   Reevaluation: After the interventions noted above, I reevaluated the patient and found that their symptoms have improved  Co morbidities that complicate the patient evaluation  Past Medical History:  Diagnosis Date   Anxiety    Depression    Gall stones    Infection    UTI   Kidney stones    Mental disorder    Vaginal Pap smear, abnormal    colpo- ok since      Dispostion: Disposition decision including need for  hospitalization was considered, and patient Left against medical advice    Final Clinical Impression(s) / ED Diagnoses Final diagnoses:  Abdominal pain, unspecified abdominal location     This chart was dictated using voice recognition software.  Despite best efforts to proofread,  errors can occur which can change the documentation meaning.    Lonell Grandchild, MD 11/11/22 [phone]

## 2022-11-10 NOTE — Discharge Instructions (Addendum)
We evaluated you for your abdominal pain and bodyaches.  Your CT scan did not show any dangerous process but did show an ovarian cyst.  We obtained an ultrasound which is still pending at this time.  You decided that you needed to leave, technically this is against medical advice since we have not gotten the results of all of your testing.  The results of your ultrasound which is still pending could show a dangerous medical condition such as a twisted ovary.  Please follow-up with an OB doctor or your primary doctor.  Your ultrasound results will show up on your MyChart account.  If you have any new or worsening symptoms such as worsening pain, vomiting, fevers, ongoing symptoms, painful urination, difficulty breathing, headaches, chest pain, back pain, diarrhea, bloody vomit, bloody stools, or any other new symptoms at all, I would encourage you to return to the emergency department.  You can return to the emergency department for any reason.

## 2022-11-10 NOTE — ED Triage Notes (Signed)
Pt presents with c/o body aches all over her body. Pt reports she feels "soreness" everywhere. Pt also reports frequent urination. Pt reports she has had this issue for years but has never been able to get an answer. Pt reports the pain has been worse the last 3 days.

## 2022-11-10 NOTE — ED Notes (Signed)
Pt requested to leave AMA. Provider made aware.

## 2022-12-07 ENCOUNTER — Ambulatory Visit
Admission: EM | Admit: 2022-12-07 | Discharge: 2022-12-07 | Disposition: A | Discharge status: Home / Self Care | Payer: Medicaid Other | Attending: Family Medicine | Admitting: Family Medicine

## 2022-12-07 ENCOUNTER — Encounter: Payer: Self-pay | Admitting: *Deleted

## 2022-12-07 ENCOUNTER — Other Ambulatory Visit: Payer: Self-pay

## 2022-12-07 DIAGNOSIS — N898 Other specified noninflammatory disorders of vagina: Secondary | ICD-10-CM | POA: Insufficient documentation

## 2022-12-07 DIAGNOSIS — R300 Dysuria: Secondary | ICD-10-CM | POA: Diagnosis present

## 2022-12-07 LAB — POCT URINALYSIS DIP (MANUAL ENTRY)
Bilirubin, UA: NEGATIVE
Blood, UA: NEGATIVE
Glucose, UA: NEGATIVE mg/dL
Ketones, POC UA: NEGATIVE mg/dL
Leukocytes, UA: NEGATIVE
Nitrite, UA: NEGATIVE
Protein Ur, POC: NEGATIVE mg/dL
Spec Grav, UA: >=1.03 — AB (ref 1.010–1.025)
Urobilinogen, UA: 0.2 U/dL
pH, UA: 6 (ref 5.0–8.0)

## 2022-12-07 MED ORDER — FLUCONAZOLE 150 MG PO TABS
ORAL_TABLET | ORAL | 0 refills | Status: AC
Start: 2022-12-07 — End: ?

## 2022-12-07 MED ORDER — METRONIDAZOLE 500 MG PO TABS: 500.0000 mg | ORAL_TABLET | Freq: Two times a day (BID) | ORAL | 0 refills | Status: AC

## 2022-12-07 NOTE — Discharge Instructions (Signed)
We have sent testing for various causes of vaginal infections. We will notify you of any positive results once they are received. If required, we will prescribe any medications you might need.  Please refrain from all sexual activity for at least the next seven days.  

## 2022-12-07 NOTE — ED Provider Notes (Signed)
[facility]   [id number] 12/07/22 Arrival Time: 0941  ASSESSMENT & PLAN:  1. Vaginal discharge   2. Dysuria    Will tx empirically for BV/yeast. Meds ordered this encounter  Medications   metroNIDAZOLE (FLAGYL) 500 MG tablet    Sig: Take 1 tablet (500 mg total) by mouth 2 (two) times daily.    Dispense:  14 tablet    Refill:  0   fluconazole (DIFLUCAN) 150 MG tablet    Sig: Take one tablet by mouth as a single dose. May repeat in 3 days if symptoms persist.    Dispense:  2 tablet    Refill:  0   U/A without signs of infection.   Discharge Instructions      We have sent testing for various causes of vaginal infections. We will notify you of any positive results once they are received. If required, we will prescribe any medications you might need.  Please refrain from all sexual activity for at least the next seven days.     Without s/s of PID.  Labs Reviewed  POCT URINALYSIS DIP (MANUAL ENTRY) - Abnormal; Notable for the following components:      Result Value   Spec Grav, UA >=1.030 (*)    All other components within normal limits  CERVICOVAGINAL ANCILLARY ONLY   Will notify of any positive results.  Reviewed expectations re: course of current medical issues. Questions answered. Outlined signs and symptoms indicating need for more acute intervention. Patient verbalized understanding. After Visit Summary given.   SUBJECTIVE:  Sue Clayton is a 37 y.o. female who presents with complaint of vaginal discharge/irritation; mild dysuria; gradual onset; x 2-3 days. H/O BV with similar symptoms. Denies fever/abd pain. No tx PTA.  Patient's last menstrual period was 11/15/2022 (approximate).   OBJECTIVE:  Vitals:   12/07/22 1055  BP: 116/80  Pulse: 70  Resp: 18  Temp: 98.2 F (36.8 C)  TempSrc: Oral  SpO2: 100%    General appearance: alert, cooperative, appears stated age and no distress GU: deferred Skin: warm and dry Psychological:  alert and cooperative; normal mood and affect.  Results for orders placed or performed during the hospital encounter of 12/07/22  POCT urinalysis dipstick  Result Value Ref Range   Color, UA yellow yellow   Clarity, UA clear clear   Glucose, UA negative negative mg/dL   Bilirubin, UA negative negative   Ketones, POC UA negative negative mg/dL   Spec Grav, UA >=4.098 (A) 1.010 - 1.025   Blood, UA negative negative   pH, UA 6.0 5.0 - 8.0   Protein Ur, POC negative negative mg/dL   Urobilinogen, UA 0.2 0.2 or 1.0 E.U./dL   Nitrite, UA Negative Negative   Leukocytes, UA Negative Negative    Labs Reviewed  POCT URINALYSIS DIP (MANUAL ENTRY) - Abnormal; Notable for the following components:      Result Value   Spec Grav, UA >=1.030 (*)    All other components within normal limits  CERVICOVAGINAL ANCILLARY ONLY    No Known Allergies  Past Medical History:  Diagnosis Date   Anxiety    Depression    Gall stones    Infection    UTI   Kidney stones    Mental disorder    Vaginal Pap smear, abnormal    colpo- ok since   Family History  Problem Relation Age of Onset   Hypertension Mother    Diabetes Mother    Social History   Socioeconomic  History   Marital status: Single    Spouse name: Not on file   Number of children: Not on file   Years of education: Not on file   Highest education level: Not on file  Occupational History   Not on file  Tobacco Use   Smoking status: Every Day    Current packs/day: 0.50    Average packs/day: 0.5 packs/day for 19.0 years (9.5 ttl pk-yrs)    Types: Cigarettes   Smokeless tobacco: Never  Vaping Use   Vaping status: Never Used  Substance and Sexual Activity   Alcohol use: Yes    Comment: weekends   Drug use: Not Currently    Frequency: 2.0 times per week    Types: Marijuana   Sexual activity: Yes    Birth control/protection: None  Other Topics Concern   Not on file  Social History Narrative   Not on file   Social  Determinants of Health   Financial Resource Strain: Not on file  Food Insecurity: No Food Insecurity (06/09/2020)   Hunger Vital Sign    Worried About Running Out of Food in the Last Year: Never true    Ran Out of Food in the Last Year: Never true  Transportation Needs: Unmet Transportation Needs (06/09/2020)   PRAPARE - Administrator, Civil Service (Medical): Yes    Lack of Transportation (Non-Medical): No  Physical Activity: Not on file  Stress: Not on file  Social Connections: Unknown (05/17/2021)   Received from [facility]   Social Network    Social Network: Not on file  Intimate Partner Violence: Unknown (04/08/2021)   Received from [facility], [facility]   HITS    Physically Hurt: Not on file    Insult or Talk Down To: Not on file    Threaten Physical Harm: Not on file    Scream or Curse: Not on file           Mardella Layman, MD 12/07/22 1207

## 2022-12-07 NOTE — ED Notes (Signed)
Pt reports 3 days of pain with urination with vaginal discomfort and discharge

## 2022-12-07 NOTE — ED Triage Notes (Signed)
Pt reports 3 days of pain with urination with vaginal discomfort and discharge

## 2022-12-09 LAB — CERVICOVAGINAL ANCILLARY ONLY
Bacterial Vaginitis (gardnerella): POSITIVE — AB
Candida Glabrata: NEGATIVE
Candida Vaginitis: POSITIVE — AB
Chlamydia: NEGATIVE
Comment: NEGATIVE
Comment: NEGATIVE
Comment: NEGATIVE
Comment: NEGATIVE
Comment: NEGATIVE
Comment: NORMAL
Neisseria Gonorrhea: NEGATIVE
Trichomonas: NEGATIVE

## 2023-04-07 ENCOUNTER — Encounter (HOSPITAL_COMMUNITY): Payer: Self-pay | Admitting: *Deleted

## 2023-04-07 ENCOUNTER — Inpatient Hospital Stay (HOSPITAL_COMMUNITY)

## 2023-04-07 ENCOUNTER — Inpatient Hospital Stay (HOSPITAL_COMMUNITY)
Admission: AD | Admit: 2023-04-07 | Discharge: 2023-04-07 | Disposition: A | Discharge status: Home / Self Care | Attending: Obstetrics and Gynecology | Admitting: Obstetrics and Gynecology

## 2023-04-07 DIAGNOSIS — R109 Unspecified abdominal pain: Secondary | ICD-10-CM | POA: Diagnosis present

## 2023-04-07 DIAGNOSIS — O039 Complete or unspecified spontaneous abortion without complication: Secondary | ICD-10-CM

## 2023-04-07 DIAGNOSIS — F17210 Nicotine dependence, cigarettes, uncomplicated: Secondary | ICD-10-CM | POA: Diagnosis not present

## 2023-04-07 DIAGNOSIS — O99331 Smoking (tobacco) complicating pregnancy, first trimester: Secondary | ICD-10-CM | POA: Insufficient documentation

## 2023-04-07 DIAGNOSIS — Z113 Encounter for screening for infections with a predominantly sexual mode of transmission: Secondary | ICD-10-CM | POA: Diagnosis present

## 2023-04-07 DIAGNOSIS — Z6740 Type O blood, Rh positive: Secondary | ICD-10-CM | POA: Insufficient documentation

## 2023-04-07 DIAGNOSIS — O209 Hemorrhage in early pregnancy, unspecified: Secondary | ICD-10-CM | POA: Diagnosis present

## 2023-04-07 DIAGNOSIS — Z3A08 8 weeks gestation of pregnancy: Secondary | ICD-10-CM | POA: Insufficient documentation

## 2023-04-07 DIAGNOSIS — O4690 Antepartum hemorrhage, unspecified, unspecified trimester: Secondary | ICD-10-CM

## 2023-04-07 LAB — CBC WITH DIFFERENTIAL/PLATELET
Abs Immature Granulocytes: 0.01 10*3/uL (ref 0.00–0.07)
Basophils Absolute: 0 10*3/uL (ref 0.0–0.1)
Basophils Relative: 1 %
Eosinophils Absolute: 0.4 10*3/uL (ref 0.0–0.5)
Eosinophils Relative: 4 %
HCT: 40 % (ref 36.0–46.0)
Hemoglobin: 13.4 g/dL (ref 12.0–15.0)
Immature Granulocytes: 0 %
Lymphocytes Relative: 47 %
Lymphs Abs: 3.7 10*3/uL (ref 0.7–4.0)
MCH: 28.6 pg (ref 26.0–34.0)
MCHC: 33.5 g/dL (ref 30.0–36.0)
MCV: 85.3 fL (ref 80.0–100.0)
Monocytes Absolute: 0.7 10*3/uL (ref 0.1–1.0)
Monocytes Relative: 9 %
Neutro Abs: 3.1 10*3/uL (ref 1.7–7.7)
Neutrophils Relative %: 39 %
Platelets: 313 10*3/uL (ref 150–400)
RBC: 4.69 MIL/uL (ref 3.87–5.11)
RDW: 14.5 % (ref 11.5–15.5)
WBC: 7.9 10*3/uL (ref 4.0–10.5)
nRBC: 0 % (ref 0.0–0.2)

## 2023-04-07 LAB — URINALYSIS, ROUTINE W REFLEX MICROSCOPIC
Bilirubin Urine: NEGATIVE
Glucose, UA: NEGATIVE mg/dL
Ketones, ur: NEGATIVE mg/dL
Leukocytes,Ua: NEGATIVE
Nitrite: NEGATIVE
Protein, ur: NEGATIVE mg/dL
Specific Gravity, Urine: 1.02 (ref 1.005–1.030)
pH: 6 (ref 5.0–8.0)

## 2023-04-07 LAB — URINALYSIS, MICROSCOPIC (REFLEX): RBC / HPF: >50 RBC/hpf (ref 0–5)

## 2023-04-07 LAB — HCG, QUANTITATIVE, PREGNANCY: hCG, Beta Chain, Quant, S: 4177 m[IU]/mL — ABNORMAL HIGH (ref <5)

## 2023-04-07 LAB — POCT PREGNANCY, URINE: Preg Test, Ur: POSITIVE — AB

## 2023-04-07 LAB — ABO/RH: ABO/RH(D): O POS

## 2023-04-07 MED ORDER — HYDROCODONE-ACETAMINOPHEN 5-300 MG PO TABS: 1.0000 | ORAL_TABLET | Freq: Four times a day (QID) | ORAL | 0 refills | Status: DC | PRN

## 2023-04-07 MED ORDER — MISOPROSTOL 200 MCG PO TABS
800.0000 ug | ORAL_TABLET | Freq: Once | ORAL | Status: AC
Start: 2023-04-07 — End: 2023-04-07
  Administered 2023-04-07: 800 ug via BUCCAL
  Filled 2023-04-07: qty 4

## 2023-04-07 MED ORDER — IBUPROFEN 800 MG PO TABS: 800.0000 mg | ORAL_TABLET | Freq: Three times a day (TID) | ORAL | 0 refills | Status: AC | PRN

## 2023-04-07 MED ORDER — KETOROLAC TROMETHAMINE 60 MG/2ML IM SOLN
60.0000 mg | Freq: Once | INTRAMUSCULAR | Status: AC
  Administered 2023-04-07: 60 mg via INTRAMUSCULAR
  Filled 2023-04-07: qty 2

## 2023-04-07 MED ORDER — HYDROCODONE-ACETAMINOPHEN 5-325 MG PO TABS: 1.0000 | ORAL_TABLET | Freq: Four times a day (QID) | ORAL | 0 refills | Status: AC | PRN

## 2023-04-07 MED ORDER — HYDROMORPHONE HCL 1 MG/ML IJ SOLN
1.0000 mg | Freq: Once | INTRAMUSCULAR | Status: AC
  Administered 2023-04-07: 1 mg via INTRAMUSCULAR
  Filled 2023-04-07: qty 1

## 2023-04-07 MED ORDER — ACETAMINOPHEN 500 MG PO TABS
1000.0000 mg | ORAL_TABLET | Freq: Four times a day (QID) | ORAL | Status: DC | PRN
  Administered 2023-04-07: 1000 mg via ORAL
  Filled 2023-04-07: qty 2

## 2023-04-07 NOTE — MAU Provider Note (Signed)
 History     CSN: [id number]  Arrival date and time: 04/07/23 1015   Event Date/Time   First Provider Initiated Contact with Patient 04/07/23 1113      Chief Complaint  Patient presents with   Vaginal Bleeding   Abdominal Pain   Patient presents with several days of spotting and cramping. She started with rust colored spotting on Tuesday, which has intensified over the course of the ensuing days. This morning the cramping has gotten worse, and her bleeding increased. She is now having bright red bleeding and reports passing a nickel sized clot on the toilet. Recently told she has a left sided ovarian cyst and gall stones.  Vaginal Bleeding The patient's primary symptoms include pelvic pain and vaginal bleeding. This is a new problem. The current episode started in the past 7 days. The problem has been unchanged. The pain is moderate. The problem affects both sides. She is pregnant. Associated symptoms include abdominal pain. Pertinent negatives include no fever, nausea or vomiting. The vaginal discharge was bloody and thick. She has been passing clots.  Abdominal Pain Pertinent negatives include no fever, nausea or vomiting.    OB History     Gravida  11   Para  4   Term  4   Preterm      AB  5   Living  4      SAB      IAB  5   Ectopic      Multiple      Live Births  4           Past Medical History:  Diagnosis Date   Anxiety    Depression    Gall stones    Infection    UTI   Kidney stones    Mental disorder    Vaginal Pap smear, abnormal    colpo- ok since    Past Surgical History:  Procedure Laterality Date   CESAREAN SECTION     LITHOTRIPSY      Family History  Problem Relation Age of Onset   Hypertension Mother    Diabetes Mother     Social History   Tobacco Use   Smoking status: Every Day    Current packs/day: 0.50    Average packs/day: 0.5 packs/day for 19.0 years (9.5 ttl pk-yrs)    Types: Cigarettes   Smokeless tobacco:  Never  Vaping Use   Vaping status: Never Used  Substance Use Topics   Alcohol use: Yes    Comment: weekends   Drug use: Not Currently    Frequency: 2.0 times per week    Types: Marijuana    Allergies: No Known Allergies  Medications Prior to Admission  Medication Sig Dispense Refill Last Dose/Taking   fluconazole (DIFLUCAN) 150 MG tablet Take one tablet by mouth as a single dose. May repeat in 3 days if symptoms persist. (Patient not taking: Reported on 04/07/2023) 2 tablet 0 Not Taking   metroNIDAZOLE (FLAGYL) 500 MG tablet Take 1 tablet (500 mg total) by mouth 2 (two) times daily. (Patient not taking: Reported on 04/07/2023) 14 tablet 0 Not Taking   phentermine (ADIPEX-P) 37.5 MG tablet Take 37.5 mg by mouth every morning. (Patient not taking: Reported on 04/07/2023)   Not Taking   Vitamin D, Ergocalciferol, (DRISDOL) 1.25 MG (50000 UNIT) CAPS capsule Take 50,000 Units by mouth once a week. (Patient not taking: Reported on 04/07/2023)   Not Taking    Review of Systems  Constitutional:  Negative for fever.  Gastrointestinal:  Positive for abdominal pain. Negative for nausea and vomiting.  Genitourinary:  Positive for pelvic pain and vaginal bleeding.   Physical Exam   Blood pressure (!) 142/95, pulse (!) 128, temperature 98 F (36.7 C), resp. rate 18, last menstrual period 01/16/2023.  Physical Exam Cardiovascular:     Rate and Rhythm: Normal rate and regular rhythm.  Pulmonary:     Effort: Pulmonary effort is normal.     Breath sounds: Normal breath sounds.  Neurological:     Mental Status: She is alert.     MAU Course  US OB Comp Less 14 Wks  Date/Time: 04/07/2023 1:42 PM  Performed by: Gerrit Heck, DO Authorized by: Strafford Bing, MD  Consent: Verbal consent obtained. Consent given by: patient Patient understanding: patient states understanding of the procedure being performed Patient consent: the patient's understanding of the procedure matches consent  given    MDM Patient with positive pregnancy test, at this time cannot rule out ectopic vs. IUP with threatened spontaneous abortion. Will proceed with early pregnancy labs including OB US, ABO/Rh, qualitative hCG, CBC with diff, and GC/Chlamydia probe. Will provide tylenol for pain relief and re-evaluate after further testing results are obtained.   1300: Patient still with cramping and pain. IM toradol 60 mg ordered, given  1405: [name] confirms failed IUP at 8w 2days, discussed [name] results in detail with the patient, recommended speculum exam to assess for fetal parts. Patient declined after risk benefit discussion. Requested cytotec and discharge home  Assessment and Plan   Miscarriage  Vaginal bleeding in pregnancy  [redacted] weeks gestation of pregnancy  Type O blood, Rh positive   -cytotec 800 mg buccal -IM dilaudid 1 mg for pain control - patient to go home with PRN hydrocodone-acetaminophen 5-300 mg and Ibuprofen 800 mg - expected course and return precautions discussed.    Gerrit Heck 04/07/2023, 1:42 PM

## 2023-04-07 NOTE — MAU Note (Signed)
.  Sue Clayton is a 38 y.o. at Unknown here in MAU reporting: started having brown spotting 2 days ago. Today started having cramping and red vag bleeding and passing small clot.   LMP: 01/16/2023 Onset of complaint: 2 days Pain score: 9 Vitals:   04/07/23 1035  BP: (!) 142/95  Pulse: (!) 128  Resp: 18  Temp: 98 F (36.7 C)     FHT: na  Lab orders placed from triage: UPT

## 2023-04-10 ENCOUNTER — Encounter: Payer: Self-pay | Admitting: Family Medicine

## 2023-04-10 LAB — GC/CHLAMYDIA PROBE AMP (~~LOC~~) NOT AT ARMC
Chlamydia: NEGATIVE
Comment: NEGATIVE
Comment: NORMAL
Neisseria Gonorrhea: NEGATIVE

## 2023-04-11 ENCOUNTER — Encounter: Payer: Self-pay | Admitting: Obstetrics and Gynecology

## 2023-04-18 ENCOUNTER — Other Ambulatory Visit

## 2023-04-18 ENCOUNTER — Other Ambulatory Visit: Payer: Self-pay

## 2023-04-18 ENCOUNTER — Ambulatory Visit: Admitting: Advanced Practice Midwife

## 2023-04-18 DIAGNOSIS — O039 Complete or unspecified spontaneous abortion without complication: Secondary | ICD-10-CM

## 2023-04-26 ENCOUNTER — Ambulatory Visit: Admitting: Obstetrics & Gynecology

## 2023-09-20 ENCOUNTER — Ambulatory Visit

## 2023-09-25 ENCOUNTER — Emergency Department (HOSPITAL_COMMUNITY): Admission: EM | Admit: 2023-09-25 | Discharge: 2023-09-25 | Disposition: A | Discharge status: Home / Self Care

## 2023-09-25 ENCOUNTER — Emergency Department (HOSPITAL_COMMUNITY)

## 2023-09-25 ENCOUNTER — Encounter (HOSPITAL_COMMUNITY): Payer: Self-pay | Admitting: Emergency Medicine

## 2023-09-25 ENCOUNTER — Other Ambulatory Visit: Payer: Self-pay

## 2023-09-25 DIAGNOSIS — R109 Unspecified abdominal pain: Secondary | ICD-10-CM | POA: Diagnosis present

## 2023-09-25 DIAGNOSIS — K8080 Other cholelithiasis without obstruction: Secondary | ICD-10-CM

## 2023-09-25 DIAGNOSIS — K8020 Calculus of gallbladder without cholecystitis without obstruction: Secondary | ICD-10-CM | POA: Insufficient documentation

## 2023-09-25 DIAGNOSIS — N200 Calculus of kidney: Secondary | ICD-10-CM | POA: Diagnosis not present

## 2023-09-25 LAB — CBC WITH DIFFERENTIAL/PLATELET
Abs Immature Granulocytes: 0.02 K/uL (ref 0.00–0.07)
Basophils Absolute: 0 K/uL (ref 0.0–0.1)
Basophils Relative: 0 %
Eosinophils Absolute: 0.2 K/uL (ref 0.0–0.5)
Eosinophils Relative: 4 %
HCT: 35.7 % — ABNORMAL LOW (ref 36.0–46.0)
Hemoglobin: 11.5 g/dL — ABNORMAL LOW (ref 12.0–15.0)
Immature Granulocytes: 0 %
Lymphocytes Relative: 55 %
Lymphs Abs: 3.6 K/uL (ref 0.7–4.0)
MCH: 27.7 pg (ref 26.0–34.0)
MCHC: 32.2 g/dL (ref 30.0–36.0)
MCV: 86 fL (ref 80.0–100.0)
Monocytes Absolute: 0.6 K/uL (ref 0.1–1.0)
Monocytes Relative: 9 %
Neutro Abs: 2.2 K/uL (ref 1.7–7.7)
Neutrophils Relative %: 32 %
Platelets: 249 K/uL (ref 150–400)
RBC: 4.15 MIL/uL (ref 3.87–5.11)
RDW: 15.9 % — ABNORMAL HIGH (ref 11.5–15.5)
WBC: 6.7 K/uL (ref 4.0–10.5)
nRBC: 0 % (ref 0.0–0.2)

## 2023-09-25 LAB — URINALYSIS, ROUTINE W REFLEX MICROSCOPIC
Bilirubin Urine: NEGATIVE
Glucose, UA: NEGATIVE mg/dL
Hgb urine dipstick: NEGATIVE
Ketones, ur: NEGATIVE mg/dL
Leukocytes,Ua: NEGATIVE
Nitrite: NEGATIVE
Protein, ur: NEGATIVE mg/dL
Specific Gravity, Urine: 1.019 (ref 1.005–1.030)
pH: 5 (ref 5.0–8.0)

## 2023-09-25 LAB — COMPREHENSIVE METABOLIC PANEL WITH GFR
ALT: 22 U/L (ref 0–44)
AST: 24 U/L (ref 15–41)
Albumin: 3.7 g/dL (ref 3.5–5.0)
Alkaline Phosphatase: 61 U/L (ref 38–126)
Anion gap: 13 (ref 5–15)
BUN: 10 mg/dL (ref 6–20)
CO2: 21 mmol/L — ABNORMAL LOW (ref 22–32)
Calcium: 8.9 mg/dL (ref 8.9–10.3)
Chloride: 105 mmol/L (ref 98–111)
Creatinine, Ser: 0.7 mg/dL (ref 0.44–1.00)
GFR, Estimated: >60 mL/min (ref >60)
Glucose, Bld: 93 mg/dL (ref 70–99)
Potassium: 4 mmol/L (ref 3.5–5.1)
Sodium: 139 mmol/L (ref 135–145)
Total Bilirubin: 0.4 mg/dL (ref 0.0–1.2)
Total Protein: 6.5 g/dL (ref 6.5–8.1)

## 2023-09-25 LAB — HCG, SERUM, QUALITATIVE: Preg, Serum: NEGATIVE

## 2023-09-25 LAB — LIPASE, BLOOD: Lipase: 27 U/L (ref 11–51)

## 2023-09-25 MED ORDER — KETOROLAC TROMETHAMINE 15 MG/ML IJ SOLN
15.0000 mg | Freq: Once | INTRAMUSCULAR | Status: AC
  Administered 2023-09-25: 15 mg via INTRAVENOUS
  Filled 2023-09-25: qty 1

## 2023-09-25 MED ORDER — ONDANSETRON HCL 4 MG/2ML IJ SOLN
4.0000 mg | Freq: Once | INTRAMUSCULAR | Status: AC
  Administered 2023-09-25: 4 mg via INTRAVENOUS
  Filled 2023-09-25: qty 2

## 2023-09-25 MED ORDER — IOHEXOL 300 MG/ML  SOLN
100.0000 mL | Freq: Once | INTRAMUSCULAR | Status: AC | PRN
Start: 2023-09-25 — End: 2023-09-25
  Administered 2023-09-25: 100 mL via INTRAVENOUS

## 2023-09-25 MED ORDER — NAPROXEN 500 MG PO TABS: 500.0000 mg | ORAL_TABLET | Freq: Two times a day (BID) | ORAL | 0 refills | Status: AC

## 2023-09-25 MED ORDER — MORPHINE SULFATE (PF) 4 MG/ML IV SOLN
4.0000 mg | Freq: Once | INTRAVENOUS | Status: AC
  Administered 2023-09-25: 4 mg via INTRAVENOUS
  Filled 2023-09-25: qty 1

## 2023-09-25 NOTE — ED Provider Notes (Signed)
 [place] EMERGENCY DEPARTMENT AT [facility] Provider Note   CSN: [id number] Arrival date & time: 09/25/23  9045     Patient presents with: Back Pain   Sue Clayton is a 38 y.o. female.   38 year old female with no reported past medical history presenting to the emergency department today with left-sided flank pain.  The patient states has been going on and off now for the past few months.  Reports that it is much worse this morning.  The patient is also having some dysuria which have been on for the past few days.  Denies any vaginal bleeding or discharge.  She reports normal bowel movements with this.  States she has had kidney stones in the past and this does feel similar.  She came to the emergency department today for further evaluation due to these ongoing symptoms.   Back Pain      Prior to Admission medications   Medication Sig Start Date End Date Taking? Authorizing Provider  naproxen  (NAPROSYN ) 500 MG tablet Take 1 tablet (500 mg total) by mouth 2 (two) times daily. 09/25/23  Yes Ula Prentice SAUNDERS, MD  fluconazole  (DIFLUCAN ) 150 MG tablet Take one tablet by mouth as a single dose. May repeat in 3 days if symptoms persist. Patient not taking: Reported on 04/07/2023 12/07/22   Rolinda Rogue, MD  HYDROcodone -acetaminophen  (NORCO/VICODIN) 5-325 MG tablet Take 1-2 tablets by mouth every 6 (six) hours as needed for moderate pain (pain score 4-6). 04/07/23   Rasch, Delon I, NP  ibuprofen  (ADVIL ) 800 MG tablet Take 1 tablet (800 mg total) by mouth every 8 (eight) hours as needed. 04/07/23   Cleotilde Lukes, DO  metroNIDAZOLE  (FLAGYL ) 500 MG tablet Take 1 tablet (500 mg total) by mouth 2 (two) times daily. Patient not taking: Reported on 04/07/2023 12/07/22   Rolinda Rogue, MD  phentermine (ADIPEX-P) 37.5 MG tablet Take 37.5 mg by mouth every morning. Patient not taking: Reported on 04/07/2023 12/14/21   [provider]  Vitamin D, Ergocalciferol, (DRISDOL) 1.25 MG  (50000 UNIT) CAPS capsule Take 50,000 Units by mouth once a week. Patient not taking: Reported on 04/07/2023 08/23/22   [provider]    Allergies: Patient has no known allergies.    Review of Systems  Genitourinary:  Positive for flank pain.  Musculoskeletal:  Positive for back pain.  All other systems reviewed and are negative.   Updated Vital Signs BP (!) 136/93 (BP Location: Left Arm)   Pulse 76   Temp (!) 97.4 F (36.3 C) (Oral)   Resp 18   LMP 01/16/2023   SpO2 100%   Breastfeeding Unknown   Physical Exam Vitals and nursing note reviewed.   Gen: NAD Eyes: PERRL, EOMI HEENT: no oropharyngeal swelling Neck: trachea midline Resp: clear to auscultation bilaterally Card: RRR, no murmurs, rubs, or gallops The patient is tender over the left periumbilical region as well as having some left-sided CVA tenderness Extremities: no calf tenderness, no edema Vascular: 2+ radial pulses bilaterally, 2+ DP pulses bilaterally Skin: no rashes Psyc: acting appropriately   (all labs ordered are listed, but only abnormal results are displayed) Labs Reviewed  CBC WITH DIFFERENTIAL/PLATELET - Abnormal; Notable for the following components:      Result Value   Hemoglobin 11.5 (*)    HCT 35.7 (*)    RDW 15.9 (*)    All other components within normal limits  COMPREHENSIVE METABOLIC PANEL WITH GFR - Abnormal; Notable for the following components:  CO2 21 (*)    All other components within normal limits  URINALYSIS, ROUTINE W REFLEX MICROSCOPIC  LIPASE, BLOOD  HCG, SERUM, QUALITATIVE    EKG: None  Radiology: CT ABDOMEN PELVIS W CONTRAST Result Date: 09/25/2023 EXAM: CT ABDOMEN AND PELVIS WITH CONTRAST 09/25/2023 01:12:10 PM TECHNIQUE: CT of the abdomen and pelvis was performed with the administration of intravenous contrast. Multiplanar reformatted images are provided for review. Automated exposure control, iterative reconstruction, and/or weight-based adjustment of the  mA/kV was utilized to reduce the radiation dose to as low as reasonably achievable. COMPARISON: 11/10/2022 CLINICAL HISTORY: Abdominal pain, acute, nonlocalized. PT reports suprapubic pain and lower back pain x months. PT also reports burning with urination. Denies fevers. FINDINGS: LOWER CHEST: No acute abnormality. LIVER: The liver is unremarkable. GALLBLADDER AND BILE DUCTS: Cholelithiasis. No gallbladder wall thickening. No pericholecystic fluid. No biliary ductal dilatation. SPLEEN: No acute abnormality. PANCREAS: No acute abnormality. ADRENAL GLANDS: No acute abnormality. KIDNEYS, URETERS AND BLADDER: Nonobstructing 3 mm interpolar right renal stone. No hydronephrosis. No perinephric or periureteral stranding. Urinary bladder is unremarkable. GI AND BOWEL: Stomach demonstrates no acute abnormality. There is no bowel obstruction. PERITONEUM AND RETROPERITONEUM: No ascites. No free air. VASCULATURE: Aorta is normal in caliber. LYMPH NODES: No lymphadenopathy. REPRODUCTIVE ORGANS: No acute abnormality. BONES AND SOFT TISSUES: Minimal thoracolumbar spondylosis. No acute osseous abnormality. No focal soft tissue abnormality. IMPRESSION: 1. No acute abnormalities in the abdomen or pelvis. 2. Incidental cholelithiasis and a nonobstructing 3 mm right renal calculus; see report body for details. Electronically signed by: Selinda Blue MD 09/25/2023 01:56 PM EDT RP Workstation: [id number]     Procedures   Medications Ordered in the ED  morphine  (PF) 4 MG/ML injection 4 mg (4 mg Intravenous Given 09/25/23 1059)  ondansetron  (ZOFRAN ) injection 4 mg (4 mg Intravenous Given 09/25/23 1058)  iohexol  (OMNIPAQUE ) 300 MG/ML solution 100 mL (100 mLs Intravenous Contrast Given 09/25/23 1304)  ketorolac  (TORADOL ) 15 MG/ML injection 15 mg (15 mg Intravenous Given 09/25/23 1406)                                    Medical Decision Making 38 year old female with past medical history of kidney stones in the past presenting to  the emergency department today with flank pain and some dysuria.  The patient states that she has been tested for sexually transmitted infections recently and has not been sexually active since does not have concern for sexually transmitted infections at this time.  Will further evaluate her here with basic labs including LFTs and a lipase to evaluate for hepatobiliary pathology or pancreatitis.  Will also obtain a CT scan to evaluate for recurrent kidney stones.  Will also obtain an hCG here to eval for intrauterine versus ectopic pregnancy.  I will give patient morphine  Zofran  for symptoms and reevaluate for ultimate disposition.  The patient's work appears reassuring.  Her symptoms did improve with medications here.  She is having some mild right upper quadrant tenderness but she basically has generalized abdominal discomfort on exam.  There is no guarding or rebound and negative Murphy sign.  Patient's LFTs are within normal limits.  CT scan does show some cholelithiasis which may be causing her symptoms.  Will treat the patient with NSAIDs and have her follow-up with surgery.  Have also encouraged her to follow-up with her OB/GYN for further evaluation although her CT scan was grossly normal.  The patient  is discharged with return precautions.  Amount and/or Complexity of Data Reviewed Labs: ordered. Radiology: ordered.  Risk Prescription drug management.        Final diagnoses:  Biliary calculus of other site without obstruction    ED Discharge Orders          Ordered    naproxen  (NAPROSYN ) 500 MG tablet  2 times daily        09/25/23 1408               Ula Prentice SAUNDERS, MD 09/25/23 1409

## 2023-09-25 NOTE — Discharge Instructions (Addendum)
 Your workup today was reassuring overall.  Your CT scan did show some gallstones.  This can cause some intermittent pain.  You may want to follow-up with your OB/GYN as well as other things that can cause intermittent abdominal pain such as endometriosis are worked up as an outpatient generally.  Please take the naproxen  twice daily and follow-up.  Return to the emergency department for fevers or worsening symptoms.

## 2023-09-25 NOTE — ED Triage Notes (Signed)
 PT reports suprapubic pain and lower back pain x months. PT also reports burning with urination. Denies fevers.

## 2023-10-19 ENCOUNTER — Ambulatory Visit: Payer: Self-pay
